# Patient Record
Sex: Female | Born: 1940 | Race: White | Hispanic: No | Smoking: Former smoker
Health system: Southern US, Community
[De-identification: ages and names within clinical notes are randomized; demographics above are authoritative.]

## PROBLEM LIST (undated history)

## (undated) DIAGNOSIS — I10 Essential (primary) hypertension: Secondary | ICD-10-CM

## (undated) DIAGNOSIS — M519 Unspecified thoracic, thoracolumbar and lumbosacral intervertebral disc disorder: Secondary | ICD-10-CM

## (undated) DIAGNOSIS — G709 Myoneural disorder, unspecified: Secondary | ICD-10-CM

## (undated) DIAGNOSIS — M48062 Spinal stenosis, lumbar region with neurogenic claudication: Secondary | ICD-10-CM

## (undated) DIAGNOSIS — Z111 Encounter for screening for respiratory tuberculosis: Secondary | ICD-10-CM

## (undated) DIAGNOSIS — R011 Cardiac murmur, unspecified: Secondary | ICD-10-CM

## (undated) DIAGNOSIS — Z8489 Family history of other specified conditions: Secondary | ICD-10-CM

## (undated) DIAGNOSIS — I639 Cerebral infarction, unspecified: Secondary | ICD-10-CM

## (undated) DIAGNOSIS — M169 Osteoarthritis of hip, unspecified: Secondary | ICD-10-CM

## (undated) HISTORY — PX: TONSILLECTOMY: SUR1361

## (undated) HISTORY — PX: BREAST SURGERY: SHX581

## (undated) HISTORY — PX: BACK SURGERY: SHX140

## (undated) HISTORY — PX: DILATION AND CURETTAGE OF UTERUS: SHX78

---

## 1998-03-16 ENCOUNTER — Other Ambulatory Visit: Admission: RE | Admit: 1998-03-16 | Discharge: 1998-03-16 | Payer: Self-pay

## 2000-07-06 ENCOUNTER — Encounter: Payer: Self-pay | Admitting: *Deleted

## 2000-07-06 ENCOUNTER — Ambulatory Visit (HOSPITAL_COMMUNITY): Admission: RE | Admit: 2000-07-06 | Discharge: 2000-07-07 | Payer: Self-pay | Admitting: *Deleted

## 2000-11-02 ENCOUNTER — Encounter: Payer: Self-pay | Admitting: Emergency Medicine

## 2000-11-02 ENCOUNTER — Encounter: Payer: Self-pay | Admitting: Neurology

## 2000-11-02 ENCOUNTER — Inpatient Hospital Stay (HOSPITAL_COMMUNITY): Admission: EM | Admit: 2000-11-02 | Discharge: 2000-11-04 | Payer: Self-pay | Admitting: Emergency Medicine

## 2002-01-17 ENCOUNTER — Encounter: Admission: RE | Admit: 2002-01-17 | Discharge: 2002-02-01 | Payer: Self-pay | Admitting: Neurology

## 2002-02-21 ENCOUNTER — Encounter: Admission: RE | Admit: 2002-02-21 | Discharge: 2002-05-22 | Payer: Self-pay | Admitting: Neurology

## 2002-10-16 ENCOUNTER — Ambulatory Visit (HOSPITAL_COMMUNITY): Admission: RE | Admit: 2002-10-16 | Discharge: 2002-10-16 | Payer: Self-pay | Admitting: Gastroenterology

## 2002-10-16 ENCOUNTER — Encounter (INDEPENDENT_AMBULATORY_CARE_PROVIDER_SITE_OTHER): Payer: Self-pay | Admitting: *Deleted

## 2004-07-23 ENCOUNTER — Emergency Department (HOSPITAL_COMMUNITY): Admission: EM | Admit: 2004-07-23 | Discharge: 2004-07-23 | Payer: Self-pay | Admitting: Emergency Medicine

## 2005-09-28 ENCOUNTER — Other Ambulatory Visit: Admission: RE | Admit: 2005-09-28 | Discharge: 2005-09-28 | Payer: Self-pay | Admitting: Diagnostic Radiology

## 2005-11-22 ENCOUNTER — Ambulatory Visit (HOSPITAL_COMMUNITY): Admission: RE | Admit: 2005-11-22 | Discharge: 2005-11-22 | Payer: Self-pay | Admitting: Neurosurgery

## 2006-06-13 ENCOUNTER — Inpatient Hospital Stay (HOSPITAL_COMMUNITY): Admission: RE | Admit: 2006-06-13 | Discharge: 2006-06-14 | Payer: Self-pay | Admitting: Neurosurgery

## 2007-09-18 ENCOUNTER — Ambulatory Visit (HOSPITAL_COMMUNITY): Admission: RE | Admit: 2007-09-18 | Discharge: 2007-09-18 | Payer: Self-pay | Admitting: Gastroenterology

## 2008-03-27 IMAGING — RF DG MYELOGRAM LUMBAR
10 series · 10 of 10 positions shown · non-contrast
Comparison: None.

CLINICAL DATA: Back pain.  Pain and numbness, left leg.  
 LUMBAR MYELOGRAM:
 Lumbar puncture injection of contrast was performed by Dr. Stein Rune.  On the lateral view, there are anterior extradural defects at L2-3, L3-4 and L4-5, the largest appearing to be at L2-3.  The alignment is normal.  Ovaries appear to exit without significant foraminal narrowing.
TECHNIQUE: Multidetector CT imaging of the lumbar spine was performed after intrathecal injection of contrast.  Multiplanar CT image reconstructions were also generated.

[Series 1: run · 1 of 1 slices shown (1 of 10)]
[im 1/1]
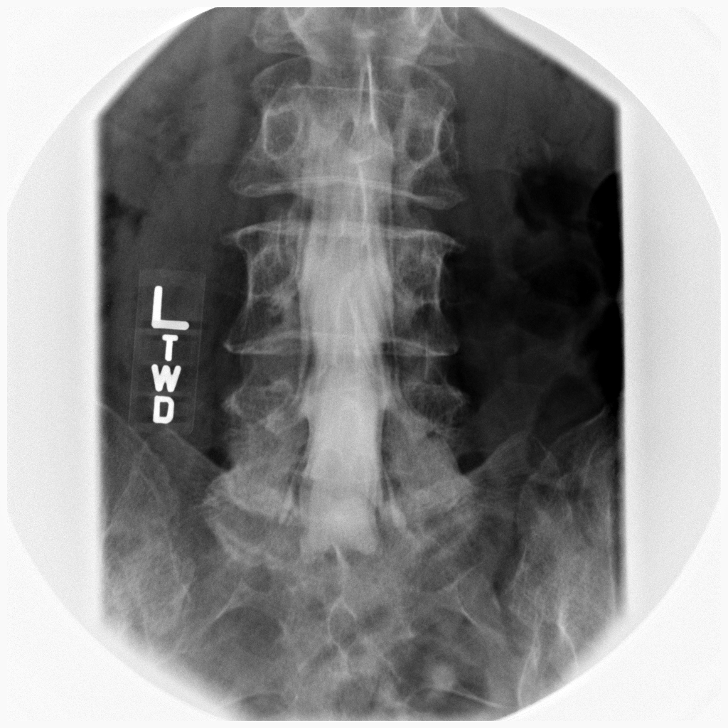

[Series 2: run · 1 of 1 slices shown (2 of 10)]
[im 1/1]
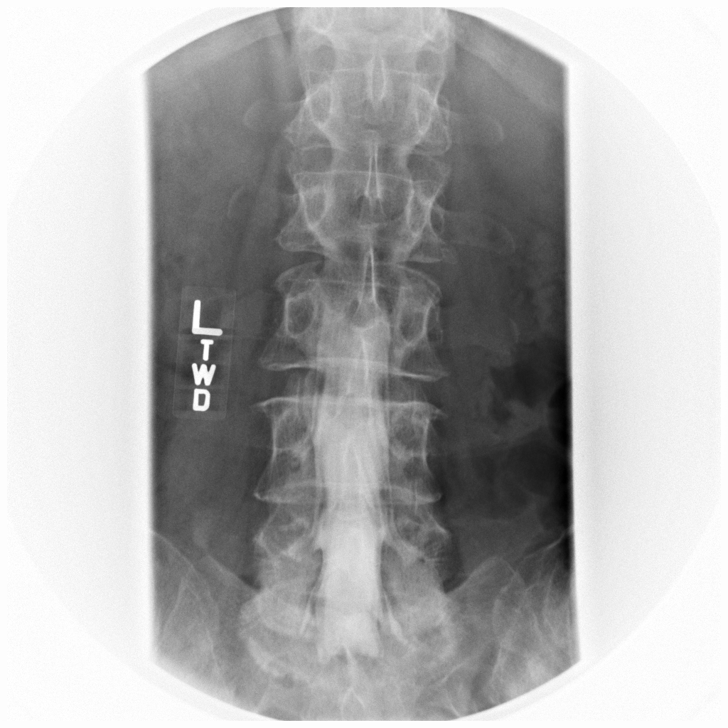

[Series 3: run · 1 of 1 slices shown (3 of 10)]
[im 1/1]
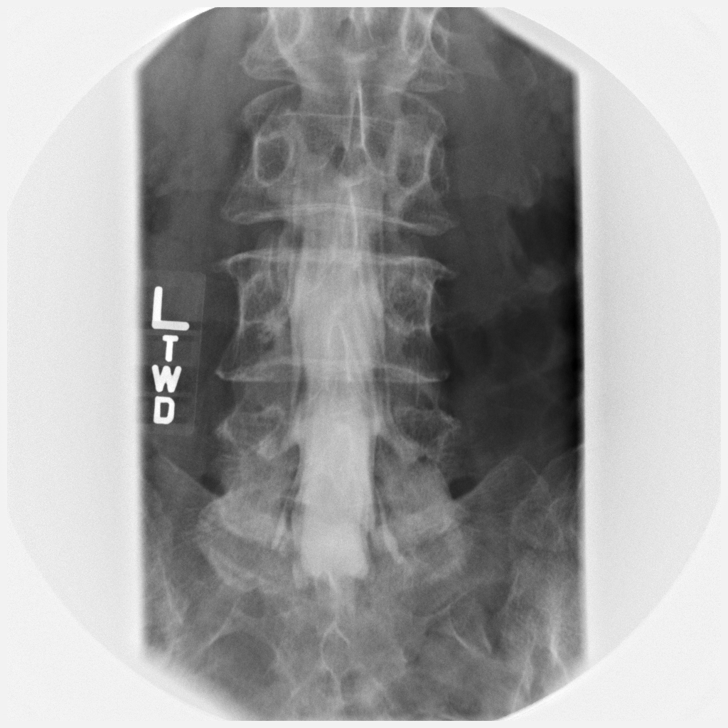

[Series 4: run · 1 of 1 slices shown (4 of 10)]
[im 1/1]
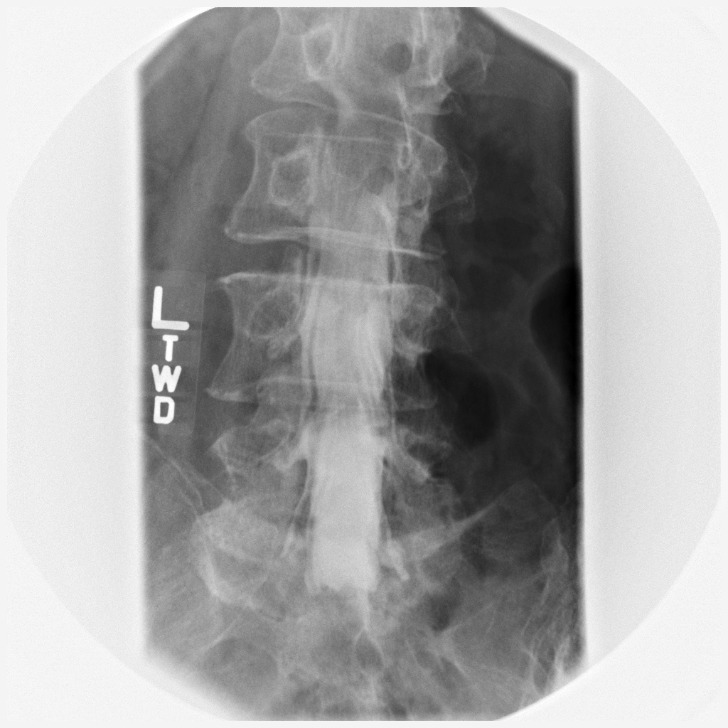

[Series 5: run · 1 of 1 slices shown (5 of 10)]
[im 1/1]
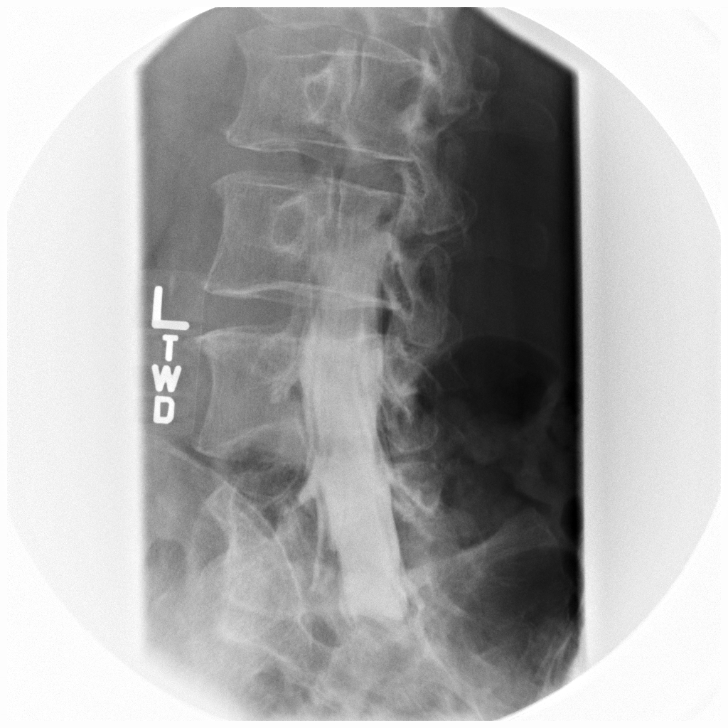

[Series 6: run · 1 of 1 slices shown (6 of 10)]
[im 1/1]
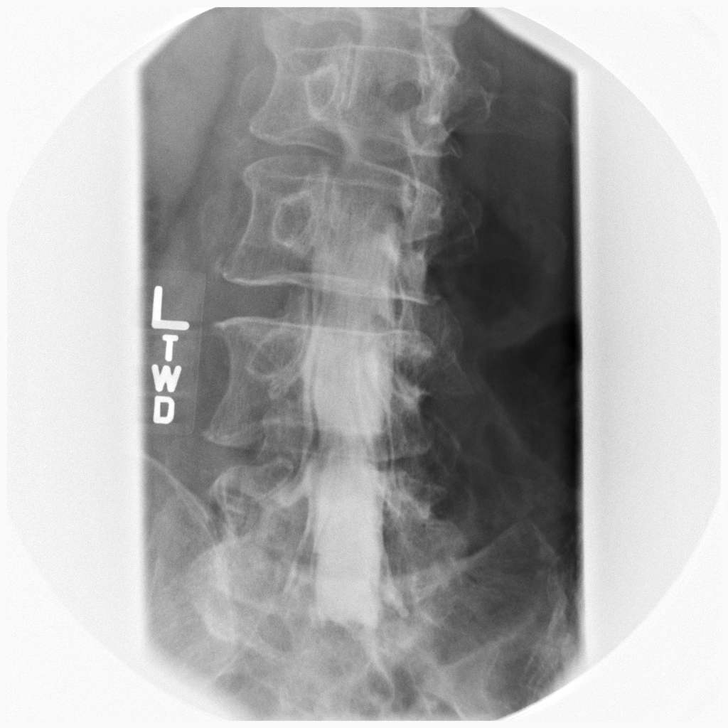

[Series 7: run · 1 of 1 slices shown (7 of 10)]
[im 1/1]
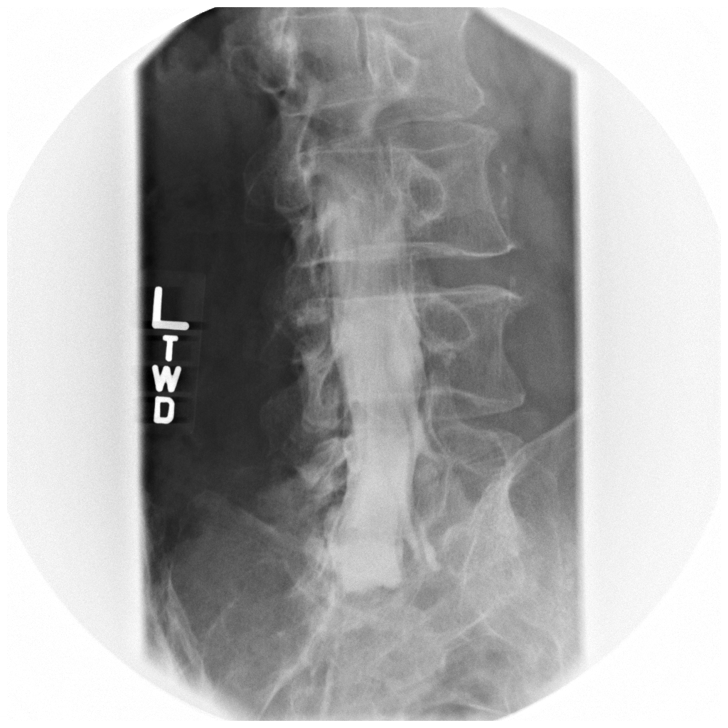

[Series 8: run · 1 of 1 slices shown (8 of 10)]
[im 1/1]
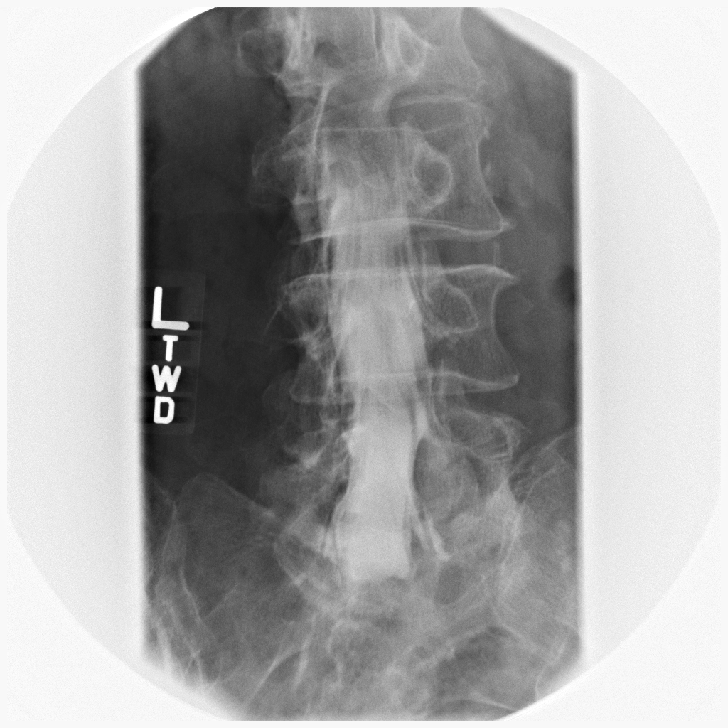

[Series 9: run · 1 of 1 slices shown (9 of 10)]
[im 1/1]
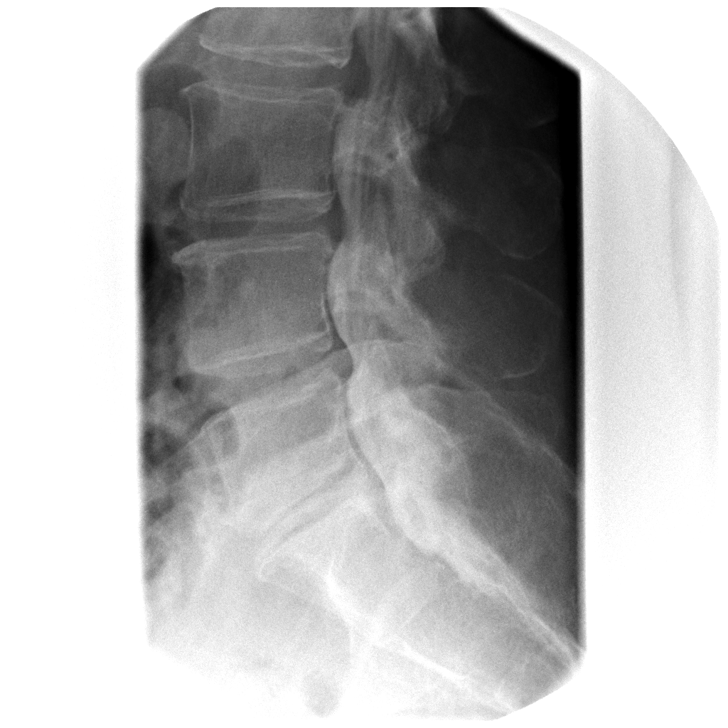

[Series 10: run · 1 of 1 slices shown (10 of 10)]
[im 1/1]
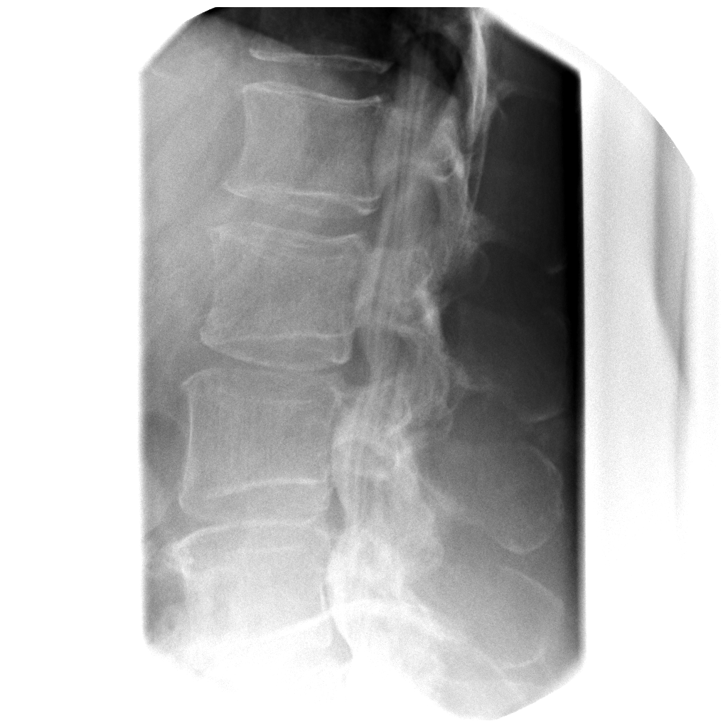

[10 of 10 positions shown; findings below may reference images not displayed]

IMPRESSION: Anterior extradural defects at L2-3, L3-4 and L4-5.
 CT MYELOGRAM, LUMBAR SPINE:
FINDINGS: The lumbar alignment is normal.  There is no fracture or mass.  The conus medullaris is normal.
 T11-12:  Negative.
 T12-L1:  Negative.
 L1-2:  Negative.
 L2-3: There is diffuse bulging of the disc.  There is a small focal disc protrusion in the right foramen which is not causing significant impingement of the L2 nerve root.  No significant central canal stenosis.  Stenosis is more prominent on the myelogram than it is on the CT at this level.  
 L3-4:  There is moderate bulging of the disc.  There is mild facet and ligamentum flavum hypertrophy without significant spinal stenosis.
 L4-5:  There is diffuse bulging of the disc with mild facet arthropathy.  No significant spinal stenosis.
 L5-S1:  Disc degeneration with vacuum phenomenon.  The disc bulges and there is mild spondylitic change without significant spinal stenosis.  There is mild foraminal narrowing bilaterally due to spurring.
 There has been prior laminectomy on the left.
IMPRESSION: 1.  Disc bulging and a small right foraminal disc protrusion at L2-3 without significant spinal stenosis.
 2.  Disc degeneration and bulging at L3-4 and L4-5.  
 3.  Postop changes on the left at L5-S1.  There is spondylitic change and mild biforaminal narrowing without significant central canal stenosis.

## 2008-04-14 ENCOUNTER — Encounter: Admission: RE | Admit: 2008-04-14 | Discharge: 2008-04-14 | Payer: Self-pay | Admitting: Family Medicine

## 2009-01-28 ENCOUNTER — Emergency Department (HOSPITAL_BASED_OUTPATIENT_CLINIC_OR_DEPARTMENT_OTHER): Admission: EM | Admit: 2009-01-28 | Discharge: 2009-01-28 | Payer: Self-pay | Admitting: Emergency Medicine

## 2009-01-28 ENCOUNTER — Ambulatory Visit: Payer: Self-pay | Admitting: Diagnostic Radiology

## 2009-01-30 ENCOUNTER — Inpatient Hospital Stay (HOSPITAL_COMMUNITY): Admission: AD | Admit: 2009-01-30 | Discharge: 2009-01-31 | Payer: Self-pay | Admitting: Neurosurgery

## 2009-01-30 ENCOUNTER — Encounter: Payer: Self-pay | Admitting: Emergency Medicine

## 2009-02-02 ENCOUNTER — Emergency Department (HOSPITAL_COMMUNITY): Admission: EM | Admit: 2009-02-02 | Discharge: 2009-02-02 | Payer: Self-pay | Admitting: Emergency Medicine

## 2010-06-23 LAB — POCT I-STAT, CHEM 8
Chloride: 92 mEq/L — ABNORMAL LOW (ref 96–112)
HCT: 44 % (ref 36.0–46.0)
Hemoglobin: 15 g/dL (ref 12.0–15.0)
Potassium: 3.7 mEq/L (ref 3.5–5.1)
Sodium: 132 mEq/L — ABNORMAL LOW (ref 135–145)

## 2010-06-23 LAB — URINALYSIS, ROUTINE W REFLEX MICROSCOPIC
Nitrite: NEGATIVE
Specific Gravity, Urine: 1.012 (ref 1.005–1.030)
Urobilinogen, UA: 0.2 mg/dL (ref 0.0–1.0)
pH: 6 (ref 5.0–8.0)

## 2010-06-23 LAB — BASIC METABOLIC PANEL
Calcium: 9.2 mg/dL (ref 8.4–10.5)
GFR calc Af Amer: >60 mL/min (ref >60)
GFR calc non Af Amer: >60 mL/min (ref >60)
Potassium: 3.9 mEq/L (ref 3.5–5.1)
Sodium: 127 mEq/L — ABNORMAL LOW (ref 135–145)

## 2010-06-23 LAB — DIFFERENTIAL
Basophils Absolute: 0.1 10*3/uL (ref 0.0–0.1)
Basophils Absolute: 0.1 10*3/uL (ref 0.0–0.1)
Basophils Relative: 1 % (ref 0–1)
Eosinophils Relative: 1 % (ref 0–5)
Lymphocytes Relative: 11 % — ABNORMAL LOW (ref 12–46)
Lymphocytes Relative: 30 % (ref 12–46)
Lymphs Abs: 1.5 10*3/uL (ref 0.7–4.0)
Monocytes Absolute: 0.3 10*3/uL (ref 0.1–1.0)
Monocytes Absolute: 0.8 10*3/uL (ref 0.1–1.0)
Neutro Abs: 3.2 10*3/uL (ref 1.7–7.7)
Neutro Abs: 7.8 10*3/uL — ABNORMAL HIGH (ref 1.7–7.7)
Neutrophils Relative %: 80 % — ABNORMAL HIGH (ref 43–77)

## 2010-06-23 LAB — APTT: aPTT: 28 seconds (ref 24–37)

## 2010-06-23 LAB — CBC
HCT: 42.7 % (ref 36.0–46.0)
Hemoglobin: 13.8 g/dL (ref 12.0–15.0)
Hemoglobin: 15.1 g/dL — ABNORMAL HIGH (ref 12.0–15.0)
MCHC: 35.3 g/dL (ref 30.0–36.0)
RBC: 3.98 MIL/uL (ref 3.87–5.11)
RDW: 12.8 % (ref 11.5–15.5)
WBC: 5.1 10*3/uL (ref 4.0–10.5)

## 2010-06-23 LAB — PROTIME-INR: Prothrombin Time: 12.6 seconds (ref 11.6–15.2)

## 2010-06-23 LAB — SAMPLE TO BLOOD BANK

## 2010-08-06 NOTE — Op Note (Signed)
Mary Adkins, DILEO                       ACCOUNT NO.:  [id number]   MEDICAL RECORD NO.:  [id number]                   PATIENT TYPE:  AMB   LOCATION:  ENDO                                 FACILITY:  [facility]   PHYSICIAN:  Anselmo Rod, M.D.               DATE OF BIRTH:  08/17/1940   DATE OF PROCEDURE:  10/16/2002  DATE OF DISCHARGE:                                 OPERATIVE REPORT   PROCEDURE PERFORMED:  Colonoscopy with snare polypectomy x1 and cold  biopsies x1.   ENDOSCOPIST:  Anselmo Rod, M.D.   INSTRUMENT USED:  Olympus video colonoscope.   INDICATIONS FOR PROCEDURE:  This 70 year old white female with a family  history of colon and breast cancer.  Rule out colonic polyps, masses, etc.   PREPROCEDURE PREPARATION:  Informed consent was procured from the patient.  The patient was fasted for eight hours prior to the procedure and prepped  with a bottle of magnesium citrate and a gallon of GoLYTELY the night prior  to the procedure.   PREPROCEDURE PHYSICAL:  VITAL SIGNS:  The patient had stable vital signs.  NECK:  Supple.  CHEST:  Clear to auscultation.  S1 and S2 regular.  ABDOMEN:  Soft with normal bowel sounds.   DESCRIPTION OF THE PROCEDURE:  The patient was placed in the left lateral  decubitus position and sedated with 60 mg of Demerol and 6 mg of Versed  intravenously.  Once the patient was adequately sedated and maintained on  low-flow oxygen and continuous cardiac monitoring, the Olympus video  colonoscope was advanced from the rectum to the cecum with some difficulty.  There was some residual stool in the colon.  A small polyp was snared from  20 cm, and another small sessile polyp was cold biopsied at 10 cm.  There  were a few early diverticula in the left colon.  The rest of the colonic  mucosa, including the transverse colon, right colon, and cecum appeared  normal.  The terminal ileum emptied normal as well.  The patient tolerated  the procedure  well without complications.   IMPRESSION:  1. Two polyps removed from the left colon (see description above).  2. A few early left-sided diverticula.  3. Normal-appearing transverse colon, right colon, cecum, and terminal     ileum.   RECOMMENDATIONS:  1. Await pathology results.  2.     Avoid nonsteroidals, including aspirin for the next two weeks.  3. High-fiber diet with liberal fluid intake.  4. Outpatient followup in the next two weeks with further recommendations.                                               Anselmo Rod, M.D.    JNM/MEDQ  D:  10/16/2002  T:  10/16/2002  Job:  [id number]   cc:   Teena Irani. Arlyce Dice, M.D.  [address]  [place]  [place] [address]  Fax: [phone]

## 2010-08-06 NOTE — H&P (Signed)
[facility]  Patient:    JONESSA, TRIPLETT                    MRN: [id number] Adm. Date:  [id number] Attending:  Cathren Laine CC:         Gloriajean Dell. Andrey Campanile, M.D.   History and Physical  HISTORY OF PRESENT ILLNESS:  Chauntay Paszkiewicz is a 70 year old right-handed white female born January 22, 1941 with a history of tobacco abuse.  This patient comes to [facility] emergency room for an evaluation of new onset of right-sided weakness.  The patient was out in the garden today picking tomatoes, came back into the house, began to feel dizzy, and then developed numbness of the right leg.  The patient then developed some numbness of the right arm - not clear whether the face was involved - but then developed some weakness of the right arm and the right leg.  The patient attempted to walk but was staggery; did not fall.  The patient at one point lost use - the husband claims - of the right leg, was unable to move it at all.  The patient lost all use of her right hand.  The patient denied any headache, visual filed changes, slurred speech.  Patient did note some dizziness but did not have any vomiting or loss of consciousness.  The patient was brought to the emergency room by EMS for further evaluation.  CT scan of the brain is unremarkable. Blood work is not yet available at the time of this dictation.  Neurology was asked to see this patient for further evaluation.  No prior history of stroke or TIA is noted.  PAST MEDICAL HISTORY: 1. History of new onset of right-sided weakness. 2. Status post left L5-S1 microdiskectomy. 3. History of breast lumpectomy in the past. 4. History of tonsillectomy.  MEDICATIONS PRIOR TO THIS ADMISSION: 1. Prometrium 100 mg daily. 2. Estradiol 0.5 mg daily. 3. Minocycline.  ALLERGIES:  Patient states an allergy to PENICILLIN, ERYTHROMYCIN, PREDNISONE, and CODEINE.  HABITS:  Patient smokes a pack of cigarettes a day.  Drinks  two glasses of red wine daily.  Was already drinking wine at 10 a.m this morning at the time the stroke developed.  SOCIAL HISTORY:  Patient lives in the [place] area, is married, has three children who are alive and well.  The patient is not working currently.  FAMILY MEDICAL HISTORY:  Notable for the fact that the mother died with stroke.  Father died with lung cancer.  Patient has one brother who is schizophrenic, may have a peripheral neuropathy.  Family history is positive for heart disease and diabetes on both sides of the family.  REVIEW OF SYSTEMS:  Notable for no fevers or chills.  Patient denies any neck pain.  Denies any shortness of breath.  Has a pressure feeling or heavy sensation on the chest.  Denies nausea, vomiting, abdominal pain, blackout episodes, bowel or bladder control problems.  PHYSICAL EXAMINATION:  VITAL SIGNS:  Blood pressure 119/43, heart rate 87 and regular, respiratory rate 24, temperature afebrile.  GENERAL:  This patient is a well-developed white female who is alert and cooperative at the time of examination.  HEENT:  Head is atraumatic.  Eyes reveal pupils are equal, round and reactive to light.  Disks are flat bilaterally.  NECK:  Supple.  No carotid bruits are noted.  ABDOMEN:  Reveals positive bowel sounds.  No organomegaly or tenderness noted.  RESPIRATORY:  Clear.  CARDIOVASCULAR:  Reveals a regular rate and rhythm.  No obvious murmurs or rubs were noted.  EXTREMITIES:  Without significant edema.  NEUROLOGIC:  Cranial nerves as above.  Facial symmetry is present.  Patient notes good sensation of face to pinprick and soft touch bilaterally.  Patient has good strength to facial muscles and the muscles of head turning and shoulder shrug bilaterally.  Extraocular movements are full.  Visual fields are full.  Speech is well-enunciated, not aphasic or dysarthric.  Motor testing reveals 4/5 strength through the grip; 4+/5 biceps,  triceps strength; 4/5 deltoid strength on the right.  The patient has 4 to 4+/5 strength to the right lower extremity.  Is able to lift the leg up off the bed against resistance.  The patient has ataxia to finger-nose-finger and toe-to-finger on the right side, normal on the left.  Good strength was noted on the left side of the body.  Deep tendon reflexes are relatively symmetric at this point. Toes are neutral bilaterally.  Patient has very definite right upper extremity drift.  Pinprick sensation is slightly depressed on the right arm and right leg as compared to the left.  Vibratory sensation is symmetric.  The patient is not ambulated.  LABORATORY DATA:  Laboratory values are pending.  Chest x-ray is unremarkable.  CT of the head without contrast is unremarkable.  EKG reveals normal sinus rhythm, normal EKG.  Heart rate was 87.  IMPRESSION: 1. Right hemiparesis, left brain stroke. 2. Tobacco abuse.  This patient has onset of right-sided hemiparesis that was severe at the onset but has significantly improved since its beginning.  Patient time-wise would be a TPA candidate but given the significant improvement that has occurred will elect not to use TPA and go with IV heparin instead.  This stroke could be a pure motor lacuna.  Need to rule out a possibility of brain stem type of event, given the prominent dizziness that occurred with the onset of the deficit.  PLAN: 1. IV heparin therapy. 2. IV fluids. 3. MRI scan of the brain. 4. MR angiogram. 5. A 2-D echocardiogram. 6. Carotid Doppler study. 7. PT/OT evaluation.  I will follow patients clinical course while in-house. DD:  11/02/00 TD:  11/02/00 Job: [id number]

## 2010-08-06 NOTE — Discharge Summary (Signed)
[place]  Patient:    Mary Adkins, Mary Adkins                    MRN: [id number] Adm. Date:  [id number] Disc. Date: [id number] Attending:  Lesly Dukes CC:         Teena Irani. Arlyce Dice, M.D.  Gloriajean Dell. Andrey Campanile, M.D.   Discharge Summary  DATE OF BIRTH:  12-23-1940  This is one of several [facility] admissions for this 70 year old right-handed white female from [place], [place] admitted for evaluation of new onset right-sided weakness.  HISTORY OF PRESENT ILLNESS:  This patient was in her garden the day of admission picking tomatoes.  Came into the house and noted a sensation of dizziness, numbness in her right leg, and some numbness in her right arm.  At that time it was not certain whether or not her face was involved.  She had no associated headache, syncope, or seizure.  She lost all use of her right hand. She came to the emergency room with improvement in her symptomatology.  CT scan of the brain was unremarkable and she was admitted for further evaluation of suspected stroke.  She has no known prior history of high blood pressure, diabetes, heart disease, or stroke.  She has not been taking aspirin.  She has been on hormones, estradiol 0.5 mg q.d. and Prometrium 100 mg q.d.  PAST MEDICAL HISTORY:  Significant for new onset left brain stroke.  She is status post left L5-S1 microdiskectomy.  She has had a lumpectomy in the past and a tonsillectomy.  ALLERGIES:  PENICILLIN, ERYTHROMYCIN, PREDNISONE, CODEINE.  HABITS:  The patient does smoke one pack per day of cigarettes.  She drinks two glasses of red wine per day.  FAMILY HISTORY:  Positive for lung cancer in that her father died from lung cancer.  Notable that her mother died from stroke.  Patient has one brother who is schizophrenic.  He is married to a patient who has manic depressive illness.  There is a positive family history of peripheral neuropathy.  SOCIAL  HISTORY:  She lives in [place] and is married.  Has three children in good health.  REVIEW OF SYSTEMS:  Unremarkable.  PHYSICAL EXAMINATION  VITAL SIGNS:  Blood pressure 119/43.  GENERAL:  Unremarkable.  NEUROLOGIC:  Cranial nerve examination is normal except for possibly some decrease in the right corner of her mouth.  She had clumsiness in her right hand and arm and some slight ataxia in her right hand and arm.  Her deep tendon reflexes were symmetric at 2+ and her plantar responses were bilaterally downgoing.  She had depressed pin prick in her right hand and arm.  LABORATORIES:  CT scan of the brain was unremarkable.  A chest x-ray showed no active cardiopulmonary process.  MRI study of the brain showed 1 cm lesion of acute infarction involving the posterior limb of the internal capsule and lateral thalamus on the left.  Otherwise, was a negative study.  Intercranial MR angiography showed normal intercranial MR angiography.  A 12-lead EKG showed normal sinus rhythm, was a normal EKG.  A 2-D echocardiogram showed overall left ventricular function test to be normal, left ventricular aortic valve thickness mildly increased, trivial mitral valvular regurgitation, mild tricuspid valvular regurgitation, no cardiac source of embolism identified. Her Doppler study of the carotids showed no evidence of ICA stenosis and vertebral flow to be antegrade.  Hemoglobin 14.7, hematocrit 42.2, white blood cell  count 7000, platelet count 192,000, 63% polys, 29% lymphs, 6% monocytes, 3% eosinophils, 1% basophil. Estimated sedimentation rate 7.  PT 13.2, INR 1.0, PTT 28.  While on heparin PTT therapeutic 57-153 range.  Cholesterol 155, triglycerides 93, HDL 47, LDL 89.  Sodium 140, potassium 4.0, chloride 101, CO2 content 30, glucose 98, BUN 5, creatinine 0.8, calcium 8.7.  HOSPITAL COURSE:  Patient was admitted with right-sided clumsiness which improved significantly in the hospital, mild  right facial improved in the hospital.  Blood pressure ranged in the 120-130/80 range.  O2 saturations were 96% on room air.  She remained afebrile.  She had no recurrence of neurologic symptoms.  She was placed on heparin therapy until results of studies have returned.  IMPRESSION: 1. 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 2. Cigarette use (code 496.0). 3. Status post left L5-S1 radiculopathy with surgery (code 353.4). 4. Status post lumpectomy. 5. History of tonsillectomy.  PLAN:  Place her on aspirin 325 mg q.d.  I have recommended that she discontinue her hormonal therapy at this time.  She is not to drive a car for three weeks.  Return to see me in three weeks. DD:  11/04/00 TD:  11/05/00 Job: [id number]

## 2010-08-06 NOTE — Op Note (Signed)
[name], Mary Adkins           ACCOUNT NO.:  [id number]   MEDICAL RECORD NO.:  [id number]          PATIENT TYPE:  INP   LOCATION:  3172                         FACILITY:  [facility]   PHYSICIAN:  Hilda Lias, M.D.   DATE OF BIRTH:  [birth year]   DATE OF PROCEDURE:  06/13/2006  DATE OF DISCHARGE:                               OPERATIVE REPORT   PREOPERATIVE DIAGNOSES:  1. 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  2. Degenerative disk disease.  3. Status post 5-1 diskectomy.   POSTOPERATIVE DIAGNOSES:  1. 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  2. Degenerative disk disease.  3. Status post 5-1 diskectomy.   PROCEDURE:  1. Left L5-S1 foraminotomy.  2. Lysis of adhesions.  3. Decompression of the L5-S1 nerve root and removal of calcified      disk.  4. Microscope.   SURGEON:  Hilda Lias, M.D.   CLINICAL HISTORY:  The patient is admitted because of back pain with  radiation to the left leg.  The pain is mostly a sensory type of pain.  She has no weakness.  X-ray showed degenerative disk disease at the  level of 5-1 with a stenosis.  The patient complained of no pain  whatsoever in the right leg.  Surgery was advised and she was aware of  the possibility that she might require fusion down the line.   PROCEDURE:  The patient was taken to the OR and she was positioned in a  prone manner.  The skin was cleaned with DuraPrep.  A midline incision  following the previous one was made through the skin and subcutaneous  tissue down to the lumbar spine.  X-rays showed that indeed we were at  the level of 5-1.  Then with the microscope and the drill, we drilled  the lower part of the lamina of L5, the upper of S1 and the medial  facet.  The patient had quite a bit of adhesion and lysis was  accomplished.  At the end, we were able to retract the S1 and L5 nerve  root.  There was small calcified disk right at the takeoff of the S1.  Incision was made  and a small amount of degenerative disk partially  calcified was removed.  At the end, we have a good decompression of both  nerve roots.  Valsalva maneuver was negative.  Then fentanyl and Depo-  Medrol were left in the epidural space and the wound was closed with  Vicryl and Steri-Strips.           ______________________________  Hilda Lias, M.D.    EB/MEDQ  D:  06/13/2006  T:  06/13/2006  Job:  [id number]

## 2010-08-06 NOTE — Op Note (Signed)
[place]  Patient:    LATREECE, MOCHIZUKI                    MRN: [id number] Proc. Date: 07/06/00 Adm. Date:  [id number] Attending:  Maryanna Shape                           Operative Report  POSTOPERATIVE DIAGNOSIS:  Herniated nucleus pulposus L5-S1, left.  POSTOPERATIVE DIAGNOSIS:  Herniated nucleus pulposus L5-S1, left.  OPERATIVE PROCEDURE:  Microdiskectomy L5-S1, left.  ANESTHESIA:  General with endotracheal tube.  SURGEON:  Reynolds Bowl, M.D.  ASSISTANT:  Cammy Copa, M.D.  DESCRIPTION OF PROCEDURE:  The patient was given general anesthetic via endotracheal tube and then placed on the operative frame, prepped and draped in the usual manner for sterility.  All bony prominences were protected.  Her arms were placed in a comfortable position.  She was then prepped with DuraPrep and draped as usual.  This included the use of a Vi-Drape.  I used two #18 gauge needles to mark and identify the L5-S1 interspace. X-rays were taken, and on the basis of these localizing needles, a short posterior incision was made.  Subcutaneous tissues dissected to the lumbar fascia.  The lumbar fascia was divided vertically.  Paraspinous muscles were subperiosteally reflected laterally.  A Taylor retractor was put in place.  The soft tissues around the ligamentum flavum were cleared away and with minimal dissection came upon an opening in the ligamentum flavum near midline, placed a patty in this opening between the epidural fat and the dura and then excised the ligamentum flavum.  I then used the #4 Penfield probe to dissect. There was some very large veins in the area, protected those to keep from bleeding.  Did not use a Bovie.  Found that it was a little too tight to move without further dissection laterally.  So laterally, I removed some bone and removed bone out the foramen so that I could get the Koloa to the lateral side of the nerve  root, then gently teased it medially, exposing a fairly large HNP that was contained.  With the nerve root protected, the annulus was opened, and the fairly large mass of contained disk was removed from a pocket posterior to the L5-S1 interspace.  There was two expiration just before opening.  There were no other events or free fragments around.  Then attention was spent on emptying the disk space of all loose disk material, using a micropituitary and an Epstein push curettes, and after satisfying myself the disk space was emptied, the area was thoroughly irrigated.  We explored with the ball tip nerve root retractor and found out the foramen medially, laterally, and proximally, the nerve root was quite free. The area was irrigated.  The [facility] retractor was removed.  The lumbar fascia was approximated with figure-of-eight sutures of #1 Vicryl, subcutaneous tissues approximated with 2-0 Vicryl, and the skin edges held in that position with metal staples, following which a band-aid was applied.  The patient was removed from the operating table and returned to the recovery room in good condition.  The estimated blood loss from this procedure was negligible.  There were no apparent problems. DD:  07/06/00 TD:  07/06/00 Job: [id number]

## 2010-08-06 NOTE — H&P (Signed)
NAMEBENAY, POMEROY           ACCOUNT NO.:  [id number]   MEDICAL RECORD NO.:  [id number]          PATIENT TYPE:  INP   LOCATION:  3172                         FACILITY:  [facility]   PHYSICIAN:  Hilda Lias, M.D.   DATE OF BIRTH:  04-07-1940   DATE OF ADMISSION:  06/13/2006  DATE OF DISCHARGE:                              HISTORY & PHYSICAL   HISTORY OF PRESENT ILLNESS:  Mrs. Quaranta is a lady who was seen by me  initially about a year ago complaining of back pain that radiates down  to the left leg.  In the year 2002, she underwent a left L5-S1  diskectomy.  She had had conservative treatment of physical therapy and  steroid injections.  She is not in therapy.  We repeated the x-ray and  found that she has stenosis at the level of L5-S1 to the left which goes  along with the sensory pain that she is having.   PAST MEDICAL HISTORY:  1. L5-S1 diskectomy in 2002.  2. Tumor removed from the breast.   SHE IS ALLERGIC TO:  1. LYRICA.  2. NEURONTIN.  3. LIPITOR.  4. PREDINSONE.  5. PENICILLIN.  6. CODEINE.  7. FOSAMAX.   FAMILY HISTORY:  Mother has a history of high blood pressure.  There is  no history of diabetes in her family.   SOCIAL HISTORY:  She drinks once in a while and she smokes half a pack a  day.   REVIEW OF SYSTEMS:  Positive for nasal congestion, back pain, arthritis.  She has a stroke with a right-side hemiparesis.   PHYSICAL EXAMINATION:  HEAD:  Atraumatic.  NECK:  Normal.  LUNGS:  Clear.  HEART:  Heart sounds normal.  ABDOMEN:  Normal.  EXTREMITIES:  Normal pulses.  NEUROLOGIC:  Oriented x3.  I cannot find any weakness.  Reflexes are  normal.  Sensation:  She complains of burning pain which involved mostly  the L5-S1 dermatome.  There is absence of the left ankle jerk.   X-ray showed that she has a severe degenerative disk disease at the L5-  S1 with a foraminal stenosis.   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   Initially, the patient will be admitted for surgery.  The procedure will  be a left L5-S1 foraminotomy and lysis of adhesions.  She knows about  the risks such as of infection, CSF leak, worsening of pain, no  improvement whatsoever, need for further surgery, and damage to vessels  of the abdomen.           ______________________________  Hilda Lias, M.D.     EB/MEDQ  D:  06/13/2006  T:  06/13/2006  Job:  [id number]

## 2012-05-11 ENCOUNTER — Other Ambulatory Visit: Payer: Self-pay | Admitting: Family Medicine

## 2012-05-14 ENCOUNTER — Ambulatory Visit
Admission: RE | Admit: 2012-05-14 | Discharge: 2012-05-14 | Disposition: A | Discharge status: Home / Self Care | Payer: Medicare PPO | Source: Ambulatory Visit | Attending: Family Medicine | Admitting: Family Medicine

## 2012-05-15 ENCOUNTER — Other Ambulatory Visit: Payer: Self-pay

## 2012-05-28 ENCOUNTER — Other Ambulatory Visit: Payer: Self-pay | Admitting: Orthopedic Surgery

## 2012-05-28 ENCOUNTER — Other Ambulatory Visit: Payer: Self-pay | Admitting: Neurosurgery

## 2012-05-29 ENCOUNTER — Encounter (HOSPITAL_COMMUNITY): Payer: Self-pay | Admitting: Pharmacy Technician

## 2012-05-29 ENCOUNTER — Ambulatory Visit
Admission: RE | Admit: 2012-05-29 | Discharge: 2012-05-29 | Disposition: A | Discharge status: Home / Self Care | Payer: Medicare PPO | Source: Ambulatory Visit | Attending: Orthopedic Surgery | Admitting: Orthopedic Surgery

## 2012-05-29 DIAGNOSIS — M25551 Pain in right hip: Secondary | ICD-10-CM

## 2012-05-31 ENCOUNTER — Encounter (HOSPITAL_COMMUNITY)
Admission: RE | Admit: 2012-05-31 | Discharge: 2012-05-31 | Disposition: A | Discharge status: Home / Self Care | Payer: Medicare PPO | Source: Ambulatory Visit | Attending: Neurosurgery | Admitting: Neurosurgery

## 2012-05-31 ENCOUNTER — Encounter (HOSPITAL_COMMUNITY)
Admission: RE | Admit: 2012-05-31 | Discharge: 2012-05-31 | Disposition: A | Discharge status: Home / Self Care | Payer: Medicare PPO | Source: Ambulatory Visit | Attending: Orthopedic Surgery | Admitting: Orthopedic Surgery

## 2012-05-31 ENCOUNTER — Other Ambulatory Visit: Payer: Self-pay | Admitting: Physician Assistant

## 2012-05-31 ENCOUNTER — Encounter (HOSPITAL_COMMUNITY): Payer: Self-pay

## 2012-05-31 LAB — ABO/RH: ABO/RH(D): A POS

## 2012-05-31 LAB — PROTIME-INR: Prothrombin Time: 12.1 seconds (ref 11.6–15.2)

## 2012-05-31 LAB — BASIC METABOLIC PANEL
BUN: 9 mg/dL (ref 6–23)
Calcium: 9.9 mg/dL (ref 8.4–10.5)
GFR calc non Af Amer: 86 mL/min — ABNORMAL LOW (ref >90)
Glucose, Bld: 103 mg/dL — ABNORMAL HIGH (ref 70–99)
Sodium: 136 mEq/L (ref 135–145)

## 2012-05-31 LAB — CBC
MCH: 33.5 pg (ref 26.0–34.0)
MCHC: 34.8 g/dL (ref 30.0–36.0)
Platelets: 246 10*3/uL (ref 150–400)

## 2012-05-31 LAB — SURGICAL PCR SCREEN: Staphylococcus aureus: NEGATIVE

## 2012-05-31 MED ORDER — CEFAZOLIN SODIUM-DEXTROSE 2-3 GM-% IV SOLR
2.0000 g | INTRAVENOUS | Status: DC
  Filled 2012-05-31: qty 50

## 2012-05-31 NOTE — H&P (Signed)
TOTAL HIP ADMISSION H&P  Patient is admitted for right total hip arthroplasty.  Subjective:  Chief Complaint: right hip pain  HPI: Mary Adkins, 72 y.o. female, has a history of pain and functional disability in the right hip(s) due to femoral neck fracture with underlying OA and patient has failed non-surgical conservative treatments for greater than 6-8 weeks to include NSAID's and/or analgesics, use of assistive devices and activity modification.  Onset of symptoms was abrupt starting 6-8 weeks ago with gradually worsening course since that time.The patient noted no past surgery on the right hip(s).  Patient currently rates pain in the right hip at 8 out of 10 with activity. Patient has worsening of pain with activity and weight bearing, pain that interfers with activities of daily living and pain with passive range of motion. Patient has evidence of joint space narrowing and nondisplaced femoral neck fracture by imaging studies. This condition presents safety issues increasing the risk of falls.   There is no current active infection.  There are no active problems to display for this patient.  Past Medical History  Diagnosis Date  . Family history of anesthesia complication     pt's mother "died during anesthesia but they brought her back"  . Hypertension   . Heart murmur     per pt. & Dr. Cabbell  . 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 ago, took med.  . Neuromuscular disorder     hip, back  . 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 Breast surgery  Left, benign results   . Tonsillectomy    . Dilation and curettage of uterus    . Back surgery  x3 back surgery     (Not in a hospital admission) Allergies  Allergen Reactions  . Acetazolamide Other (See Comments)    Face swelled and peeled  . Codeine Nausea And Vomiting  . Crestor (Rosuvastatin) Other (See Comments)    Muscle aches  . Diazepam Other (See Comments)     Put her to sleep  . Lipitor (Atorvastatin) Other (See Comments)    Muscle aches  . Prednisone Other (See Comments)    Rapid heart rate  . Penicillins Rash    History  Substance Use Topics  . Smoking status: Former Smoker    Types: Cigarettes    Quit date: 05/04/2011  . Smokeless tobacco: Not on file  . Alcohol Use: Yes     Comment: wine - everyday- one glass     No family history on file.   Review of Systems  Constitutional: Negative.   HENT: Negative.   Eyes: Negative.   Respiratory: Negative.   Cardiovascular: Negative.   Gastrointestinal: Negative.   Genitourinary: Negative.   Musculoskeletal: Positive for joint pain. Negative for falls.  Skin: Negative.   Neurological: Negative.   Endo/Heme/Allergies: Negative.   Psychiatric/Behavioral: Negative.     Objective:  Physical Exam  Constitutional: She is oriented to person, place, and time. She appears well-developed and well-nourished. No distress.  HENT:  Head: Normocephalic and atraumatic.  Nose: Nose normal.  Eyes: Conjunctivae and EOM are normal. Pupils are equal, round, and reactive to light.  Neck: Normal range of motion. Neck supple.  Cardiovascular: Normal rate, regular rhythm and intact distal pulses.   Murmur heard. Respiratory: Effort normal and breath sounds normal. She has no wheezes. She has no rales. She exhibits no tenderness.    GI: Soft. Bowel sounds are normal. She exhibits no distension. There is no tenderness.  Musculoskeletal:       Right hip: She exhibits decreased range of motion, decreased strength, tenderness and bony tenderness. She exhibits no deformity and no laceration.  Lymphadenopathy:    She has no cervical adenopathy.  Neurological: She is alert and oriented to person, place, and time. No cranial nerve deficit.  Skin: Skin is warm and dry. No rash noted. No erythema.  Psychiatric: She has a normal mood and affect. Her behavior is normal.    Vital signs in last 24  hours: @VSRANGES@  Labs:   Estimated body mass index is 26.22 kg/(m^2) as calculated from the following:   Height as of an earlier encounter on 05/31/12: 5' 6" (1.676 m).   Weight as of an earlier encounter on 05/31/12: 73.664 kg (162 lb 6.4 oz).   Imaging Review Plain radiographs demonstrate moderate degenerative joint disease of the right hip(s). The bone quality appears to be good for age and reported activity level.  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 The treatment options including medical management, injection therapy, arthroscopy and arthroplasty were discussed at length. The risks and benefits of total hip arthroplasty were presented and reviewed. The risks due to aseptic loosening, infection, stiffness, dislocation/subluxation,  thromboembolic complications and other imponderables were discussed.  The patient acknowledged the explanation, agreed to proceed with the plan and consent was signed. Patient is being admitted for inpatient treatment for surgery, pain control, PT, OT, prophylactic antibiotics, VTE prophylaxis, progressive ambulation and ADL's and discharge planning.The patient is planning to be discharged home with home health services 

## 2012-05-31 NOTE — Progress Notes (Signed)
Call to Dr. Sueanne Margarita office, left msg. For Darl Pikes to call for clarification of surg. Plan. Call to Dr. Candise Bowens office, spoke with Tresa Endo re: surg. Plan.

## 2012-05-31 NOTE — Pre-Procedure Instructions (Signed)
Mary Adkins  05/31/2012   Your procedure is scheduled on:  06/01/2012  Report to [facility] at 5:30 AM.  Call this number if you have problems the morning of surgery: [phone]   Remember:   Do not eat food or drink liquids after midnight.   Take these medicines the morning of surgery with A SIP OF WATER: amlodipine and PAIN medicine as needed    Do not wear jewelry, make-up or nail polish.  Do not wear lotions, powders, or perfumes. You may wear deodorant.  Do not shave 48 hours prior to surgery.  Do not bring valuables to the hospital.  Contacts, dentures or bridgework may not be worn into surgery.  Leave suitcase in the car. After surgery it may be brought to your room.  For patients admitted to the hospital, checkout time is 11:00 AM the day of  discharge.   Patients discharged the day of surgery will not be allowed to drive  home.  Name and phone number of your driver: /w family  Special Instructions: Shower using CHG 2 nights before surgery and the night before surgery.  If you shower the day of surgery use CHG.  Use special wash - you have one bottle of CHG for all showers.  You should use approximately 1/3 of the bottle for each shower.   Please read over the following fact sheets that you were given: Pain Booklet, Coughing and Deep Breathing, Blood Transfusion Information, Total Joint Packet, MRSA Information and Surgical Site Infection Prevention

## 2012-05-31 NOTE — Progress Notes (Signed)
Spoke Mary Adkins at Dr. Candise Bowens office & informed her that pt. Doesn't have any orders available specific to Dr. Madelon Lips.

## 2012-05-31 NOTE — Progress Notes (Signed)
Pt. reports that she is followed at OR [facility] ) Family , unsure of when or where she had last ekg.

## 2012-06-01 ENCOUNTER — Encounter (HOSPITAL_COMMUNITY)
Admission: RE | Disposition: A | Discharge status: Home Health | Payer: Self-pay | Source: Ambulatory Visit | Attending: Orthopedic Surgery

## 2012-06-01 ENCOUNTER — Encounter (HOSPITAL_COMMUNITY): Payer: Self-pay | Admitting: Anesthesiology

## 2012-06-01 ENCOUNTER — Inpatient Hospital Stay (HOSPITAL_COMMUNITY): Payer: Medicare PPO

## 2012-06-01 ENCOUNTER — Inpatient Hospital Stay (HOSPITAL_COMMUNITY)
Admission: RE | Admit: 2012-06-01 | Discharge: 2012-06-03 | DRG: 470 | Disposition: A | Discharge status: Home Health | Payer: Medicare PPO | Source: Ambulatory Visit | Attending: Orthopedic Surgery | Admitting: Orthopedic Surgery

## 2012-06-01 ENCOUNTER — Encounter (HOSPITAL_COMMUNITY): Payer: Self-pay | Admitting: *Deleted

## 2012-06-01 ENCOUNTER — Ambulatory Visit (HOSPITAL_COMMUNITY): Payer: Medicare PPO | Admitting: Anesthesiology

## 2012-06-01 DIAGNOSIS — M51379 Other intervertebral disc degeneration, lumbosacral region without mention of lumbar back pain or lower extremity pain: Secondary | ICD-10-CM | POA: Diagnosis present

## 2012-06-01 DIAGNOSIS — Z87891 Personal history of nicotine dependence: Secondary | ICD-10-CM

## 2012-06-01 DIAGNOSIS — I69998 Other sequelae following unspecified cerebrovascular disease: Secondary | ICD-10-CM

## 2012-06-01 DIAGNOSIS — I639 Cerebral infarction, unspecified: Secondary | ICD-10-CM | POA: Diagnosis present

## 2012-06-01 DIAGNOSIS — M169 Osteoarthritis of hip, unspecified: Secondary | ICD-10-CM | POA: Diagnosis present

## 2012-06-01 DIAGNOSIS — D62 Acute posthemorrhagic anemia: Secondary | ICD-10-CM | POA: Diagnosis not present

## 2012-06-01 DIAGNOSIS — I10 Essential (primary) hypertension: Secondary | ICD-10-CM | POA: Diagnosis present

## 2012-06-01 DIAGNOSIS — E876 Hypokalemia: Secondary | ICD-10-CM | POA: Diagnosis not present

## 2012-06-01 DIAGNOSIS — M84353A Stress fracture, unspecified femur, initial encounter for fracture: Principal | ICD-10-CM | POA: Diagnosis present

## 2012-06-01 DIAGNOSIS — M519 Unspecified thoracic, thoracolumbar and lumbosacral intervertebral disc disorder: Secondary | ICD-10-CM | POA: Diagnosis present

## 2012-06-01 DIAGNOSIS — G709 Myoneural disorder, unspecified: Secondary | ICD-10-CM | POA: Diagnosis present

## 2012-06-01 DIAGNOSIS — M1610 Unilateral primary osteoarthritis, unspecified hip: Secondary | ICD-10-CM | POA: Diagnosis present

## 2012-06-01 DIAGNOSIS — R011 Cardiac murmur, unspecified: Secondary | ICD-10-CM | POA: Diagnosis present

## 2012-06-01 HISTORY — DX: Encounter for screening for respiratory tuberculosis: Z11.1

## 2012-06-01 HISTORY — DX: Cerebral infarction, unspecified: I63.9

## 2012-06-01 HISTORY — DX: Family history of other specified conditions: Z84.89

## 2012-06-01 HISTORY — PX: TOTAL HIP ARTHROPLASTY: SHX124

## 2012-06-01 HISTORY — DX: Myoneural disorder, unspecified: G70.9

## 2012-06-01 HISTORY — DX: Essential (primary) hypertension: I10

## 2012-06-01 HISTORY — DX: Cardiac murmur, unspecified: R01.1

## 2012-06-01 HISTORY — DX: Osteoarthritis of hip, unspecified: M16.9

## 2012-06-01 HISTORY — DX: Unspecified thoracic, thoracolumbar and lumbosacral intervertebral disc disorder: M51.9

## 2012-06-01 LAB — COMPREHENSIVE METABOLIC PANEL
ALT: 11 U/L (ref 0–35)
Alkaline Phosphatase: 46 U/L (ref 39–117)
BUN: 12 mg/dL (ref 6–23)
CO2: 31 mEq/L (ref 19–32)
GFR calc Af Amer: >90 mL/min (ref >90)
GFR calc non Af Amer: 85 mL/min — ABNORMAL LOW (ref >90)
Glucose, Bld: 118 mg/dL — ABNORMAL HIGH (ref 70–99)
Potassium: 4.1 mEq/L (ref 3.5–5.1)
Sodium: 140 mEq/L (ref 135–145)
Total Bilirubin: 0.4 mg/dL (ref 0.3–1.2)
Total Protein: 6.5 g/dL (ref 6.0–8.3)

## 2012-06-01 LAB — CBC
HCT: 34.6 % — ABNORMAL LOW (ref 36.0–46.0)
Hemoglobin: 12 g/dL (ref 12.0–15.0)
MCHC: 34.7 g/dL (ref 30.0–36.0)
RBC: 3.58 MIL/uL — ABNORMAL LOW (ref 3.87–5.11)

## 2012-06-01 SURGERY — LUMBAR LAMINECTOMY/DECOMPRESSION MICRODISCECTOMY 1 LEVEL
Anesthesia: General

## 2012-06-01 SURGERY — ARTHROPLASTY, HIP, TOTAL,POSTERIOR APPROACH
Anesthesia: General | Site: Hip | Laterality: Right | Wound class: Clean

## 2012-06-01 MED ORDER — BISACODYL 10 MG RE SUPP: 10.0000 mg | Freq: Every day | RECTAL | Status: DC | PRN

## 2012-06-01 MED ORDER — LACTATED RINGERS IV SOLN: INTRAVENOUS | Status: DC

## 2012-06-01 MED ORDER — LIDOCAINE HCL (CARDIAC) 20 MG/ML IV SOLN
INTRAVENOUS | Status: DC | PRN
  Administered 2012-06-01: 100 mg via INTRAVENOUS

## 2012-06-01 MED ORDER — NEOSTIGMINE METHYLSULFATE 1 MG/ML IJ SOLN
INTRAMUSCULAR | Status: DC | PRN
  Administered 2012-06-01: 4 mg via INTRAVENOUS

## 2012-06-01 MED ORDER — METOCLOPRAMIDE HCL 5 MG/ML IJ SOLN: 5.0000 mg | Freq: Three times a day (TID) | INTRAMUSCULAR | Status: DC | PRN

## 2012-06-01 MED ORDER — ROCURONIUM BROMIDE 100 MG/10ML IV SOLN
INTRAVENOUS | Status: DC | PRN
  Administered 2012-06-01: 50 mg via INTRAVENOUS
  Administered 2012-06-01: 20 mg via INTRAVENOUS

## 2012-06-01 MED ORDER — PROPOFOL 10 MG/ML IV BOLUS
INTRAVENOUS | Status: DC | PRN
  Administered 2012-06-01: 160 mg via INTRAVENOUS

## 2012-06-01 MED ORDER — VITAMIN D3 25 MCG (1000 UNIT) PO TABS
1000.0000 [IU] | ORAL_TABLET | Freq: Every day | ORAL | Status: DC
  Administered 2012-06-02 – 2012-06-03 (×2): 1000 [IU] via ORAL
  Filled 2012-06-01 (×2): qty 1

## 2012-06-01 MED ORDER — CLINDAMYCIN PHOSPHATE 900 MG/50ML IV SOLN
900.0000 mg | INTRAVENOUS | Status: AC
  Administered 2012-06-01: 900 mg via INTRAVENOUS
  Filled 2012-06-01: qty 50

## 2012-06-01 MED ORDER — PROMETHAZINE HCL 25 MG/ML IJ SOLN: 6.2500 mg | INTRAMUSCULAR | Status: DC | PRN

## 2012-06-01 MED ORDER — SODIUM CHLORIDE 0.9 % IV SOLN
INTRAVENOUS | Status: DC
  Administered 2012-06-01 – 2012-06-02 (×2): via INTRAVENOUS

## 2012-06-01 MED ORDER — PHENOL 1.4 % MT LIQD: 1.0000 | OROMUCOSAL | Status: DC | PRN

## 2012-06-01 MED ORDER — SODIUM CHLORIDE 0.9 % IV SOLN
INTRAVENOUS | Status: DC | PRN
  Administered 2012-06-01: 08:00:00 via INTRAVENOUS

## 2012-06-01 MED ORDER — PHENYLEPHRINE HCL 10 MG/ML IJ SOLN
INTRAMUSCULAR | Status: DC | PRN
  Administered 2012-06-01 (×3): 40 ug via INTRAVENOUS
  Administered 2012-06-01 (×2): 80 ug via INTRAVENOUS
  Administered 2012-06-01 (×3): 40 ug via INTRAVENOUS

## 2012-06-01 MED ORDER — ENOXAPARIN SODIUM 30 MG/0.3ML ~~LOC~~ SOLN
30.0000 mg | Freq: Two times a day (BID) | SUBCUTANEOUS | Status: DC
  Administered 2012-06-01: 30 mg via SUBCUTANEOUS
  Filled 2012-06-01 (×3): qty 0.3

## 2012-06-01 MED ORDER — LACTATED RINGERS IV SOLN
INTRAVENOUS | Status: DC | PRN
  Administered 2012-06-01 (×2): via INTRAVENOUS

## 2012-06-01 MED ORDER — HYDROMORPHONE HCL PF 1 MG/ML IJ SOLN
0.2500 mg | INTRAMUSCULAR | Status: DC | PRN
  Administered 2012-06-01 (×3): 0.25 mg via INTRAVENOUS
  Administered 2012-06-01: 0.5 mg via INTRAVENOUS
  Administered 2012-06-01: 0.25 mg via INTRAVENOUS
  Administered 2012-06-01: 0.5 mg via INTRAVENOUS

## 2012-06-01 MED ORDER — LOSARTAN POTASSIUM 25 MG PO TABS
25.0000 mg | ORAL_TABLET | Freq: Every day | ORAL | Status: DC
  Filled 2012-06-01: qty 1

## 2012-06-01 MED ORDER — ACETAMINOPHEN 650 MG RE SUPP: 650.0000 mg | Freq: Four times a day (QID) | RECTAL | Status: DC | PRN

## 2012-06-01 MED ORDER — ONDANSETRON HCL 4 MG/2ML IJ SOLN: 4.0000 mg | Freq: Four times a day (QID) | INTRAMUSCULAR | Status: DC | PRN

## 2012-06-01 MED ORDER — CLINDAMYCIN PHOSPHATE 600 MG/50ML IV SOLN
600.0000 mg | Freq: Four times a day (QID) | INTRAVENOUS | Status: AC
  Administered 2012-06-01 (×2): 600 mg via INTRAVENOUS
  Filled 2012-06-01 (×3): qty 50

## 2012-06-01 MED ORDER — GLYCOPYRROLATE 0.2 MG/ML IJ SOLN
INTRAMUSCULAR | Status: DC | PRN
  Administered 2012-06-01: 0.6 mg via INTRAVENOUS

## 2012-06-01 MED ORDER — AMLODIPINE BESYLATE 5 MG PO TABS
5.0000 mg | ORAL_TABLET | Freq: Every day | ORAL | Status: DC
  Administered 2012-06-02 – 2012-06-03 (×2): 5 mg via ORAL
  Filled 2012-06-01 (×3): qty 1

## 2012-06-01 MED ORDER — HYDROCODONE-ACETAMINOPHEN 5-325 MG PO TABS
1.0000 | ORAL_TABLET | ORAL | Status: DC | PRN
  Administered 2012-06-01: 2 via ORAL
  Administered 2012-06-01: 1 via ORAL
  Administered 2012-06-01 – 2012-06-03 (×9): 2 via ORAL
  Filled 2012-06-01 (×10): qty 2

## 2012-06-01 MED ORDER — LOSARTAN POTASSIUM-HCTZ 50-12.5 MG PO TABS: 0.5000 | ORAL_TABLET | Freq: Every day | ORAL | Status: DC

## 2012-06-01 MED ORDER — HYDROMORPHONE HCL PF 1 MG/ML IJ SOLN
INTRAMUSCULAR | Status: AC
  Filled 2012-06-01: qty 1

## 2012-06-01 MED ORDER — ONDANSETRON HCL 4 MG/2ML IJ SOLN
INTRAMUSCULAR | Status: DC | PRN
  Administered 2012-06-01: 4 mg via INTRAVENOUS

## 2012-06-01 MED ORDER — FLEET ENEMA 7-19 GM/118ML RE ENEM: 1.0000 | ENEMA | Freq: Once | RECTAL | Status: AC | PRN

## 2012-06-01 MED ORDER — ARTIFICIAL TEARS OP OINT
TOPICAL_OINTMENT | OPHTHALMIC | Status: DC | PRN
  Administered 2012-06-01: 1 via OPHTHALMIC

## 2012-06-01 MED ORDER — SODIUM CHLORIDE 0.9 % IR SOLN
Status: DC | PRN
  Administered 2012-06-01: 1000 mL

## 2012-06-01 MED ORDER — ACETAMINOPHEN 325 MG PO TABS: 650.0000 mg | ORAL_TABLET | Freq: Four times a day (QID) | ORAL | Status: DC | PRN

## 2012-06-01 MED ORDER — NICOTINE POLACRILEX 2 MG MT GUM
2.0000 mg | CHEWING_GUM | OROMUCOSAL | Status: DC | PRN
  Filled 2012-06-01: qty 1

## 2012-06-01 MED ORDER — HYDROMORPHONE HCL PF 1 MG/ML IJ SOLN
0.5000 mg | INTRAMUSCULAR | Status: DC | PRN
  Administered 2012-06-01 – 2012-06-02 (×4): 0.5 mg via INTRAVENOUS
  Filled 2012-06-01 (×4): qty 1

## 2012-06-01 MED ORDER — DOCUSATE SODIUM 100 MG PO CAPS
100.0000 mg | ORAL_CAPSULE | Freq: Two times a day (BID) | ORAL | Status: DC
  Administered 2012-06-01 – 2012-06-03 (×5): 100 mg via ORAL
  Filled 2012-06-01 (×5): qty 1

## 2012-06-01 MED ORDER — FENTANYL CITRATE 0.05 MG/ML IJ SOLN
INTRAMUSCULAR | Status: DC | PRN
  Administered 2012-06-01: 100 ug via INTRAVENOUS
  Administered 2012-06-01 (×2): 50 ug via INTRAVENOUS

## 2012-06-01 MED ORDER — ONDANSETRON HCL 4 MG PO TABS: 4.0000 mg | ORAL_TABLET | Freq: Four times a day (QID) | ORAL | Status: DC | PRN

## 2012-06-01 MED ORDER — HYDROCHLOROTHIAZIDE 10 MG/ML ORAL SUSPENSION
6.2500 mg | Freq: Every day | ORAL | Status: DC
  Filled 2012-06-01: qty 1.25

## 2012-06-01 MED ORDER — ALBUMIN HUMAN 5 % IV SOLN
INTRAVENOUS | Status: DC | PRN
  Administered 2012-06-01 (×2): via INTRAVENOUS

## 2012-06-01 MED ORDER — BUPIVACAINE-EPINEPHRINE 0.5% -1:200000 IJ SOLN
INTRAMUSCULAR | Status: DC | PRN
  Administered 2012-06-01: 20 mL

## 2012-06-01 MED ORDER — METOCLOPRAMIDE HCL 10 MG PO TABS: 5.0000 mg | ORAL_TABLET | Freq: Three times a day (TID) | ORAL | Status: DC | PRN

## 2012-06-01 MED ORDER — MENTHOL 3 MG MT LOZG
1.0000 | LOZENGE | OROMUCOSAL | Status: DC | PRN
  Filled 2012-06-01: qty 9

## 2012-06-01 MED ORDER — MIDAZOLAM HCL 5 MG/5ML IJ SOLN
INTRAMUSCULAR | Status: DC | PRN
  Administered 2012-06-01 (×2): 1 mg via INTRAVENOUS

## 2012-06-01 MED ORDER — HYDROCODONE-ACETAMINOPHEN 5-325 MG PO TABS
ORAL_TABLET | ORAL | Status: AC
  Filled 2012-06-01: qty 2

## 2012-06-01 MED ORDER — SENNOSIDES-DOCUSATE SODIUM 8.6-50 MG PO TABS: 1.0000 | ORAL_TABLET | Freq: Every evening | ORAL | Status: DC | PRN

## 2012-06-01 SURGICAL SUPPLY — 66 items
BLADE SAW SAG 73X25 THK (BLADE) ×1
BLADE SAW SGTL 73X25 THK (BLADE) ×1 IMPLANT
BNDG COHESIVE 6X5 TAN STRL LF (GAUZE/BANDAGES/DRESSINGS) ×2 IMPLANT
BRUSH FEMORAL CANAL (MISCELLANEOUS) IMPLANT
CLOTH BEACON ORANGE TIMEOUT ST (SAFETY) ×2 IMPLANT
COVER BACK TABLE 24X17X13 BIG (DRAPES) IMPLANT
COVER SURGICAL LIGHT HANDLE (MISCELLANEOUS) ×4 IMPLANT
DRAPE INCISE IOBAN 66X45 STRL (DRAPES) ×2 IMPLANT
DRAPE ORTHO SPLIT 77X108 STRL (DRAPES) ×2
DRAPE SURG ORHT 6 SPLT 77X108 (DRAPES) ×2 IMPLANT
DRAPE U-SHAPE 47X51 STRL (DRAPES) ×2 IMPLANT
DRSG ADAPTIC 3X8 NADH LF (GAUZE/BANDAGES/DRESSINGS) ×2 IMPLANT
DRSG PAD ABDOMINAL 8X10 ST (GAUZE/BANDAGES/DRESSINGS) ×2 IMPLANT
DURAPREP 26ML APPLICATOR (WOUND CARE) ×2 IMPLANT
ELECT BLADE 4.0 EZ CLEAN MEGAD (MISCELLANEOUS) ×2
ELECT CAUTERY BLADE 6.4 (BLADE) ×2 IMPLANT
ELECT REM PT RETURN 9FT ADLT (ELECTROSURGICAL) ×2
ELECTRODE BLDE 4.0 EZ CLN MEGD (MISCELLANEOUS) ×1 IMPLANT
ELECTRODE REM PT RTRN 9FT ADLT (ELECTROSURGICAL) ×1 IMPLANT
EVACUATOR 1/8 PVC DRAIN (DRAIN) IMPLANT
FACESHIELD LNG OPTICON STERILE (SAFETY) ×6 IMPLANT
GLOVE BIO SURGEON STRL SZ8.5 (GLOVE) ×6 IMPLANT
GLOVE BIOGEL PI IND STRL 8 (GLOVE) ×2 IMPLANT
GLOVE BIOGEL PI INDICATOR 8 (GLOVE) ×2
GLOVE BIOGEL PI ORTHO PRO 7.5 (GLOVE) ×1
GLOVE ORTHO TXT STRL SZ7.5 (GLOVE) IMPLANT
GLOVE PI ORTHO PRO STRL 7.5 (GLOVE) ×1 IMPLANT
GLOVE SURG ORTHO 8.0 STRL STRW (GLOVE) ×4 IMPLANT
GLOVE SURG SS PI 6.5 STRL IVOR (GLOVE) ×4 IMPLANT
GLOVE SURG SS PI 7.5 STRL IVOR (GLOVE) ×2 IMPLANT
GLOVE SURG SS PI 8.5 STRL IVOR (GLOVE) ×1
GLOVE SURG SS PI 8.5 STRL STRW (GLOVE) ×1 IMPLANT
GOWN BRE IMP SLV SIRUS LXLNG (GOWN DISPOSABLE) ×2 IMPLANT
GOWN PREVENTION PLUS XLARGE (GOWN DISPOSABLE) ×2 IMPLANT
GOWN PREVENTION PLUS XXLARGE (GOWN DISPOSABLE) ×2 IMPLANT
GOWN STRL NON-REIN LRG LVL3 (GOWN DISPOSABLE) ×2 IMPLANT
GOWN STRL REIN 2XL XLG LVL4 (GOWN DISPOSABLE) ×4 IMPLANT
HANDPIECE INTERPULSE COAX TIP (DISPOSABLE)
IMMOBILIZER KNEE 20 (SOFTGOODS)
IMMOBILIZER KNEE 20 THIGH 36 (SOFTGOODS) IMPLANT
IMMOBILIZER KNEE 22 UNIV (SOFTGOODS) ×2 IMPLANT
IMMOBILIZER KNEE 24 THIGH 36 (MISCELLANEOUS) IMPLANT
IMMOBILIZER KNEE 24 UNIV (MISCELLANEOUS)
KIT BASIN OR (CUSTOM PROCEDURE TRAY) ×2 IMPLANT
KIT ROOM TURNOVER OR (KITS) ×2 IMPLANT
MANIFOLD NEPTUNE II (INSTRUMENTS) ×2 IMPLANT
NEEDLE 22X1 1/2 (OR ONLY) (NEEDLE) ×2 IMPLANT
NEEDLE MAYO TROCAR (NEEDLE) ×2 IMPLANT
NS IRRIG 1000ML POUR BTL (IV SOLUTION) ×2 IMPLANT
PACK TOTAL JOINT (CUSTOM PROCEDURE TRAY) ×2 IMPLANT
PAD ARMBOARD 7.5X6 YLW CONV (MISCELLANEOUS) ×4 IMPLANT
PRESSURIZER FEMORAL UNIV (MISCELLANEOUS) IMPLANT
SET HNDPC FAN SPRY TIP SCT (DISPOSABLE) IMPLANT
SPONGE GAUZE 4X4 12PLY (GAUZE/BANDAGES/DRESSINGS) ×2 IMPLANT
STAPLER VISISTAT 35W (STAPLE) ×2 IMPLANT
SUCTION FRAZIER TIP 10 FR DISP (SUCTIONS) ×2 IMPLANT
SUT ETHIBOND NAB CT1 #1 30IN (SUTURE) ×8 IMPLANT
SUT VIC AB 1 CTB1 27 (SUTURE) ×4 IMPLANT
SUT VIC AB 2-0 CT1 27 (SUTURE) ×2
SUT VIC AB 2-0 CT1 TAPERPNT 27 (SUTURE) ×2 IMPLANT
SYR CONTROL 10ML LL (SYRINGE) ×2 IMPLANT
TOWEL OR 17X24 6PK STRL BLUE (TOWEL DISPOSABLE) ×2 IMPLANT
TOWEL OR 17X26 10 PK STRL BLUE (TOWEL DISPOSABLE) ×2 IMPLANT
TOWER CARTRIDGE SMART MIX (DISPOSABLE) IMPLANT
TRAY FOLEY CATH 14FR (SET/KITS/TRAYS/PACK) ×2 IMPLANT
WATER STERILE IRR 1000ML POUR (IV SOLUTION) ×2 IMPLANT

## 2012-06-01 NOTE — Interval H&P Note (Signed)
History and Physical Interval Note:  06/01/2012 7:25 AM  Mary Adkins  has presented today for surgery, with the diagnosis of RIGHT HIP: FRACTURE/FEMORAL NECK-CLOSED   The various methods of treatment have been discussed with the patient and family. After consideration of risks, benefits and other options for treatment, the patient has consented to  Procedure(s) with comments: RIGHT TOTAL HIP ARTHROPLASTY WITH AUTOGRAFT (Right) - RIGHT TOTAL HIP REPLACEMENT ANESTHESIA:  GENERAL, PRE/POST OP FEMORAL NERVE as a surgical intervention .  The patient's history has been reviewed, patient examined, no change in status, stable for surgery.  I have reviewed the patient's chart and labs.  Questions were answered to the patient's satisfaction.     CAFFREY JR,W D

## 2012-06-01 NOTE — H&P (View-Only) (Signed)
TOTAL HIP ADMISSION H&P  Patient is admitted for right total hip arthroplasty.  Subjective:  Chief Complaint: right hip pain  HPI: Mary Adkins, 72 y.o. female, has a history of pain and functional disability in the right hip(s) due to femoral neck fracture with underlying OA and patient has failed non-surgical conservative treatments for greater than 6-8 weeks to include NSAID's and/or analgesics, use of assistive devices and activity modification.  Onset of symptoms was abrupt starting 6-8 weeks ago with gradually worsening course since that time.The patient noted no past surgery on the right hip(s).  Patient currently rates pain in the right hip at 8 out of 10 with activity. Patient has worsening of pain with activity and weight bearing, pain that interfers with activities of daily living and pain with passive range of motion. Patient has evidence of joint space narrowing and nondisplaced femoral neck fracture by imaging studies. This condition presents safety issues increasing the risk of falls.   There is no current active infection.  There are no active problems to display for this patient.  Past Medical History  Diagnosis Date  . Family history of anesthesia complication     pt's mother "died during anesthesia but they brought her back"  . Hypertension   . Heart murmur     per pt. & Dr. Franky Macho  . 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 ago, took med.  . Neuromuscular disorder     hip, back  . 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 Breast surgery  Left, benign results   . Tonsillectomy    . Dilation and curettage of uterus    . Back surgery  x3 back surgery     (Not in a hospital admission) Allergies  Allergen Reactions  . Acetazolamide Other (See Comments)    Face swelled and peeled  . Codeine Nausea And Vomiting  . Crestor (Rosuvastatin) Other (See Comments)    Muscle aches  . Diazepam Other (See Comments)     Put her to sleep  . Lipitor (Atorvastatin) Other (See Comments)    Muscle aches  . Prednisone Other (See Comments)    Rapid heart rate  . Penicillins Rash    History  Substance Use Topics  . Smoking status: Former Smoker    Types: Cigarettes    Quit date: 05/04/2011  . Smokeless tobacco: Not on file  . Alcohol Use: Yes     Comment: wine - everyday- one glass     No family history on file.   Review of Systems  Constitutional: Negative.   HENT: Negative.   Eyes: Negative.   Respiratory: Negative.   Cardiovascular: Negative.   Gastrointestinal: Negative.   Genitourinary: Negative.   Musculoskeletal: Positive for joint pain. Negative for falls.  Skin: Negative.   Neurological: Negative.   Endo/Heme/Allergies: Negative.   Psychiatric/Behavioral: Negative.     Objective:  Physical Exam  Constitutional: She is oriented to Adkins, place, and time. She appears well-developed and well-nourished. No distress.  HENT:  Head: Normocephalic and atraumatic.  Nose: Nose normal.  Eyes: Conjunctivae and EOM are normal. Pupils are equal, round, and reactive to light.  Neck: Normal range of motion. Neck supple.  Cardiovascular: Normal rate, regular rhythm and intact distal pulses.   Murmur heard. Respiratory: Effort normal and breath sounds normal. She has no wheezes. She has no rales. She exhibits no tenderness.  GI: Soft. Bowel sounds are normal. She exhibits no distension. There is no tenderness.  Musculoskeletal:       Right hip: She exhibits decreased range of motion, decreased strength, tenderness and bony tenderness. She exhibits no deformity and no laceration.  Lymphadenopathy:    She has no cervical adenopathy.  Neurological: She is alert and oriented to Adkins, place, and time. No cranial nerve deficit.  Skin: Skin is warm and dry. No rash noted. No erythema.  Psychiatric: She has a normal mood and affect. Her behavior is normal.    Vital signs in last 24  hours: @VSRANGES @  Labs:   Estimated body mass index is 26.22 kg/(m^2) as calculated from the following:   Height as of an earlier encounter on 05/31/12: 5\' 6"  (1.676 m).   Weight as of an earlier encounter on 05/31/12: 73.664 kg (162 lb 6.4 oz).   Imaging Review Plain radiographs demonstrate moderate degenerative joint disease of the right hip(s). The bone quality appears to be good for age and reported activity level.  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 The treatment options including medical management, injection therapy, arthroscopy and arthroplasty were discussed at length. The risks and benefits of total hip arthroplasty were presented and reviewed. The risks due to aseptic loosening, infection, stiffness, dislocation/subluxation,  thromboembolic complications and other imponderables were discussed.  The patient acknowledged the explanation, agreed to proceed with the plan and consent was signed. Patient is being admitted for inpatient treatment for surgery, pain control, PT, OT, prophylactic antibiotics, VTE prophylaxis, progressive ambulation and ADL's and discharge planning.The patient is planning to be discharged home with home health services

## 2012-06-01 NOTE — Op Note (Signed)
Mary Adkins, Mary Adkins           ACCOUNT NO.:  [id number]  MEDICAL RECORD NO.:  [id number]  LOCATION:  5N08C                        FACILITY:  [facility]  PHYSICIAN:  Dyke Brackett, M.D.    DATE OF BIRTH:  1940-08-17  DATE OF PROCEDURE: DATE OF DISCHARGE:                              OPERATIVE REPORT   INDICATIONS:  This is a 72 year old, who presented on consultation of neurosurgery with severe leg and groin pain with some apparent issues of right-sided back pathology, but x-rays suggested ongoing stress reaction, degenerative change in the hip, and MRI showed a stress fracture of the femoral neck with an abnormal appearing head.  After consultation with the patient, it was elected to perform a total hip replacement.  PREOPERATIVE DIAGNOSIS:  Stress fracture of the femoral neck with abnormal femoral head, probable osteoarthritis.  POSTOPERATIVE DIAGNOSIS:  Stress fracture of the femoral neck with abnormal femoral head, probable osteoarthritis.  OPERATION:  Right total hip replacement (DePuy AML porous-coated femur 13.5 mm small stature, +8 mm neck length, 36 mm all metal hip ball with 52 mm sector cup with marathon poly.  SURGEON:  Dyke Brackett, M.D.  ASSISTANT:  Margart Sickles, PA-C.  ESTIMATED BLOOD LOSS:  300 mL.  DESCRIPTION OF PROCEDURE:  Sterile prep and drape in the lateral position, posterior approach to the hip made, splitting of the iliotibial band, gluteus maximus.  We split the external rotators.  T- capsulotomy was made in the hip.  Delivered the head, which actually appeared rather degenerative.  We then cut the head about 1 fingerbreadth above the lesser trochanter.  The head was sent to pathology due to the abnormality noted in the indications on the MRI. We then progressively reamed up to a 13 mm size to accept a 13.5 mm stem.  The acetabulum was then identified.  The excess labrum was removed.  We placed wing retractors superiorly and posteriorly.   There were some osteophytes that we removed around protecting the posterior inferior acetabulum.  We then reamed the acetabulum to 51 mm to accept a 52 cup and placed a cup with reaction with about 45 degrees abduction and 15 degrees of anteversion and placed a trial poly and a trial broach and trialed off this and thought that at about a +5 mm neck length, we were relatively stable.  Then we removed the trial liner and the broach, placed apex screw to hold the poly with the offset 10 degree poly seated about the 10 o'clock position.  The final prosthesis was inserted with trialing off that the neck length to eventually be a +8 mm neck length, which really with extreme flexion and internal rotation and adduction, the hip would only dislocate at 90 degrees of internal rotation with full adduction and full flexion.  Final components head was inserted. Wound was irrigated.  We closed the T- capsulotomy with #1 Ethibond.  The iliotibial band and gluteus maximus with running #1 Ethibond, and the subcu tissues with 0 and 2-0, Vicryl skin clips.  Marcaine with epinephrine was instilled into the skin, lightly compressive sterile dressing and knee immobilizer applied, taken to recovery in stable condition.     Dyke Brackett, M.D.  WDC/MEDQ  D:  06/01/2012  T:  06/01/2012  Job:  [id number]

## 2012-06-01 NOTE — Anesthesia Postprocedure Evaluation (Signed)
Anesthesia Post Note  Patient: Mary Adkins  Procedure(s) Performed: Procedure(s) (LRB): RIGHT TOTAL HIP ARTHROPLASTY WITH AUTOGRAFT (Right)  Anesthesia type: general  Patient location: PACU  Post pain: Pain level controlled  Post assessment: Patient's Cardiovascular Status Stable  Last Vitals:  Filed Vitals:   06/01/12 1000  BP:   Pulse: 83  Temp:   Resp: 10    Post vital signs: Reviewed and stable  Level of consciousness: sedated  Complications: No apparent anesthesia complications

## 2012-06-01 NOTE — Progress Notes (Signed)
UR COMPLETED  

## 2012-06-01 NOTE — Anesthesia Preprocedure Evaluation (Addendum)
Anesthesia Evaluation  Patient identified by MRN, date of birth, ID band Patient awake    Reviewed: Allergy & Precautions, H&P , NPO status , Patient's Chart, lab work & pertinent test results  History of Anesthesia Complications Negative for: history of anesthetic complications  Airway Mallampati: II TM Distance: >3 FB Neck ROM: Full    Dental  (+) Teeth Intact and Dental Advisory Given   Pulmonary neg pulmonary ROS,    Pulmonary exam normal       Cardiovascular hypertension, Pt. on medications     Neuro/Psych CVA    GI/Hepatic negative GI ROS, Neg liver ROS,   Endo/Other    Renal/GU negative Renal ROS     Musculoskeletal   Abdominal   Peds  Hematology   Anesthesia Other Findings   Reproductive/Obstetrics                          Anesthesia Physical Anesthesia Plan  ASA: III  Anesthesia Plan: General   Post-op Pain Management:    Induction: Intravenous  Airway Management Planned: Oral ETT  Additional Equipment:   Intra-op Plan:   Post-operative Plan: Extubation in OR  Informed Consent: I have reviewed the patients History and Physical, chart, labs and discussed the procedure including the risks, benefits and alternatives for the proposed anesthesia with the patient or authorized representative who has indicated his/her understanding and acceptance.   Dental advisory given  Plan Discussed with: CRNA, Anesthesiologist and Surgeon  Anesthesia Plan Comments:        Anesthesia Quick Evaluation

## 2012-06-01 NOTE — Transfer of Care (Signed)
Immediate Anesthesia Transfer of Care Note  Patient: Mary Adkins  Procedure(s) Performed: Procedure(s) with comments: RIGHT TOTAL HIP ARTHROPLASTY WITH AUTOGRAFT (Right) - RIGHT TOTAL HIP REPLACEMENT ANESTHESIA:  GENERAL, PRE/POST OP FEMORAL NERVE  Patient Location: PACU  Anesthesia Type:General  Level of Consciousness: awake, oriented, sedated and patient cooperative  Airway & Oxygen Therapy: Patient Spontanous Breathing and Patient connected to nasal cannula oxygen  Post-op Assessment: Report given to PACU RN and Post -op Vital signs reviewed and stable  Post vital signs: Reviewed and stable  Complications: No apparent anesthesia complications and Patient re-intubated

## 2012-06-01 NOTE — Preoperative (Signed)
Beta Blockers   Reason not to administer Beta Blockers:Not Applicable 

## 2012-06-01 NOTE — Brief Op Note (Signed)
06/01/2012  9:41 AM  PATIENT:  Mary Adkins  72 y.o. female  PRE-OPERATIVE DIAGNOSIS:  RIGHT HIP: FRACTURE/FEMORAL NECK-CLOSED   POST-OPERATIVE DIAGNOSIS:  RIGHT HIP: FRACTURE/FEMORAL NECK-  PROCEDURE:  Procedure(s) with comments: RIGHT TOTAL HIP ARTHROPLASTY WITH AUTOGRAFT (Right) - RIGHT TOTAL HIP REPLACEMENT ANESTHESIA:  GENERAL, PRE/POST OP FEMORAL NERVE  SURGEON:  Surgeon(s) and Role:    * W D Carloyn Manner., MD - Primary  PHYSICIAN ASSISTANT:   ASSISTANTS: Margart Sickles, PA-C   ANESTHESIA:   local and general  EBL:  Total I/O In: 1350 [I.V.:1100; IV Piggyback:250] Out: 380 [Urine:80; Blood:300]  BLOOD ADMINISTERED:none  DRAINS: none   LOCAL MEDICATIONS USED:  BUPIVICAINE   SPECIMEN:  Source of Specimen:  femoral head  DISPOSITION OF SPECIMEN:  PATHOLOGY  COUNTS:  YES  TOURNIQUET:  * No tourniquets in log *  DICTATION: .Other Dictation: Dictation Number unknown  PLAN OF CARE: Admit to inpatient   PATIENT DISPOSITION:  PACU - hemodynamically stable.   Delay start of Pharmacological VTE agent (>24hrs) due to surgical blood loss or risk of bleeding: yes

## 2012-06-01 NOTE — Clinical Social Work Note (Signed)
CSW received consult for SNF placement. Patient in room sleeping after OR at time of visit. CSW will continue to follow pending PT evaluation and recommendations.   Lia Foyer, LCSWA [facility] Clinical Social Worker Contact #: [phone]

## 2012-06-01 NOTE — Anesthesia Procedure Notes (Signed)
Procedure Name: Intubation Date/Time: 06/01/2012 7:41 AM Performed by: Marni Griffon Pre-anesthesia Checklist: Patient identified, Emergency Drugs available, Suction available and Patient being monitored Patient Re-evaluated:Patient Re-evaluated prior to inductionOxygen Delivery Method: Circle system utilized Preoxygenation: Pre-oxygenation with 100% oxygen Intubation Type: IV induction Ventilation: Mask ventilation without difficulty and Oral airway inserted - appropriate to patient size Laryngoscope Size: Mac and 3 Grade View: Grade I Tube type: Oral Tube size: 7.5 mm Number of attempts: 1 Airway Equipment and Method: Stylet Placement Confirmation: ETT inserted through vocal cords under direct vision,  positive ETCO2 and breath sounds checked- equal and bilateral Secured at: 22 (cm at teeth) cm Tube secured with: Tape Dental Injury: Teeth and Oropharynx as per pre-operative assessment

## 2012-06-02 ENCOUNTER — Encounter (HOSPITAL_COMMUNITY): Payer: Self-pay | Admitting: Physician Assistant

## 2012-06-02 DIAGNOSIS — M169 Osteoarthritis of hip, unspecified: Secondary | ICD-10-CM | POA: Diagnosis present

## 2012-06-02 DIAGNOSIS — I639 Cerebral infarction, unspecified: Secondary | ICD-10-CM | POA: Diagnosis present

## 2012-06-02 DIAGNOSIS — R011 Cardiac murmur, unspecified: Secondary | ICD-10-CM | POA: Diagnosis present

## 2012-06-02 DIAGNOSIS — G709 Myoneural disorder, unspecified: Secondary | ICD-10-CM | POA: Diagnosis present

## 2012-06-02 DIAGNOSIS — D62 Acute posthemorrhagic anemia: Secondary | ICD-10-CM | POA: Diagnosis not present

## 2012-06-02 DIAGNOSIS — I10 Essential (primary) hypertension: Secondary | ICD-10-CM | POA: Diagnosis present

## 2012-06-02 LAB — BASIC METABOLIC PANEL
CO2: 28 mEq/L (ref 19–32)
Calcium: 8.3 mg/dL — ABNORMAL LOW (ref 8.4–10.5)
Creatinine, Ser: 0.64 mg/dL (ref 0.50–1.10)

## 2012-06-02 LAB — CBC
MCH: 33.6 pg (ref 26.0–34.0)
MCV: 97.9 fL (ref 78.0–100.0)
Platelets: 199 10*3/uL (ref 150–400)
RBC: 2.89 MIL/uL — ABNORMAL LOW (ref 3.87–5.11)
RDW: 12.2 % (ref 11.5–15.5)

## 2012-06-02 MED ORDER — LOSARTAN POTASSIUM 25 MG PO TABS
25.0000 mg | ORAL_TABLET | Freq: Every day | ORAL | Status: DC
  Administered 2012-06-03: 25 mg via ORAL
  Filled 2012-06-02: qty 1

## 2012-06-02 MED ORDER — SENNOSIDES-DOCUSATE SODIUM 8.6-50 MG PO TABS
2.0000 | ORAL_TABLET | Freq: Every day | ORAL | Status: DC
  Administered 2012-06-02: 2 via ORAL
  Filled 2012-06-02: qty 2

## 2012-06-02 MED ORDER — POTASSIUM CHLORIDE CRYS ER 20 MEQ PO TBCR
20.0000 meq | EXTENDED_RELEASE_TABLET | Freq: Two times a day (BID) | ORAL | Status: DC
  Administered 2012-06-02 – 2012-06-03 (×3): 20 meq via ORAL
  Filled 2012-06-02 (×5): qty 1

## 2012-06-02 MED ORDER — ENOXAPARIN SODIUM 40 MG/0.4ML ~~LOC~~ SOLN
40.0000 mg | SUBCUTANEOUS | Status: DC
  Administered 2012-06-02: 40 mg via SUBCUTANEOUS
  Filled 2012-06-02 (×2): qty 0.4

## 2012-06-02 NOTE — Clinical Social Work Note (Signed)
Clinical Social Worker reviewed chart and noticed PT's recommendation for home health at discharge. CSW will sign off, as social work intervention is no longer needed.   Rozetta Nunnery MSW, [employer] [phone]

## 2012-06-02 NOTE — Progress Notes (Signed)
Occupational Therapy Evaluation Patient Details Name: Mary Adkins MRN: [id number] DOB: Feb 07, 1941 Today's Date: 06/02/2012 Time: 5621-3086 OT Time Calculation (min): 24 min  OT Assessment / Plan / Recommendation Clinical Impression  72 yo s/p R THA. Posterior precautions. WBAT. Educated pt on precautions, DME and AE for ADL and functional mobility. Handout given. Pt has all nec DME. Making excellent rogress. Pt will be able to D/C home tomorrow with 24/7 S of family. rec to continue to [facility].    OT Assessment  All further OT needs can be met in the next venue of care    Follow Up Recommendations  Home health OT    Barriers to Discharge  none    Equipment Recommendations  None recommended by OT    Recommendations for Other Services    Frequency    eval only   Precautions / Restrictions Precautions Precautions: Posterior Hip Precaution Booklet Issued: Yes (comment) Restrictions Weight Bearing Restrictions: Yes RLE Weight Bearing: Weight bearing as tolerated   Pertinent Vitals/Pain R hip. 5. Ice applied after session    ADL  Transfers/Ambulation Related to ADLs: minguard ADL Comments: Educated pt on posterior hip precuations and available AE. Pt verbalized understanding. Given handout    OT Diagnosis: Generalized weakness;Acute pain  OT Problem List: Decreased strength;Decreased knowledge of use of DME or AE;Decreased knowledge of precautions;Pain OT Treatment Interventions:     OT Goals Acute Rehab OT Goals OT Goal Formulation:  (eval only)  Visit Information  Last OT Received On: 06/02/12 Assistance Needed: +1    Subjective Data      Prior Functioning     Home Living Lives With: Family Available Help at Discharge: Available PRN/intermittently Type of Home: House Home Access: Stairs to enter [employer] of Steps: 1 Home Layout: One level Bathroom Shower/Tub: Health visitor: Handicapped height Bathroom Accessibility:  No Home Adaptive Equipment: Walker - rolling;Wheelchair - manual;Bedside commode/3-in-1;Shower chair with back;Reacher;Other (comment) (lift chair) Prior Function Level of Independence: Needs assistance;Independent with assistive device(s) Needs Assistance: Meal Prep;Light Housekeeping Meal Prep: Moderate Light Housekeeping: Moderate Able to Take Stairs?: Yes Comments: was independent prior to breaking leg in Oct         Vision/Perception     Cognition  Cognition Overall Cognitive Status: Appears within functional limits for tasks assessed/performed    Extremity/Trunk Assessment Right Upper Extremity Assessment RUE ROM/Strength/Tone: Within functional levels Left Upper Extremity Assessment LUE ROM/Strength/Tone: Within functional levels     Mobility Bed Mobility Bed Mobility: Not assessed Transfers Transfers: Sit to Stand;Stand to Sit Sit to Stand: 4: Min guard;From chair/3-in-1 Stand to Sit: 4: Min guard;To chair/3-in-1 Details for Transfer Assistance: vc for technique and following precautions     Exercise     Balance  WFL   End of Session OT - End of Session Equipment Utilized During Treatment: Gait belt Activity Tolerance: Patient tolerated treatment well Patient left: in chair;with call bell/phone within reach Nurse Communication: Mobility status;Precautions  GO     Amirra Herling,HILLARY 06/02/2012, 11:17 AM Luisa Dago, OTR/L  [phone] 06/02/2012

## 2012-06-02 NOTE — Progress Notes (Signed)
Physical Therapy Treatment Patient Details Name: Mary Adkins MRN: [id number] DOB: Feb 12, 1941 Today's Date: 06/02/2012 Time: 1501-1530 PT Time Calculation (min): 29 min  PT Assessment / Plan / Recommendation Comments on Treatment Session  Grandaughter present for entire session. PT answered questions from pt and family.  Should be ready for DC tomorrow from a mobility standpoint.      Follow Up Recommendations  Home health PT;Other (comment)     Does the patient have the potential to tolerate intense rehabilitation     Barriers to Discharge        Equipment Recommendations  None recommended by PT    Recommendations for Other Services    Frequency 7X/week   Plan Discharge plan remains appropriate    Precautions / Restrictions Precautions Precautions: Posterior Hip Precaution Booklet Issued: Yes (comment) Restrictions Weight Bearing Restrictions: Yes RLE Weight Bearing: Weight bearing as tolerated   Pertinent Vitals/Pain C/o R hip pain with exercise.  Had pain meds about 1 hour prior to session.       Mobility  Bed Mobility Bed Mobility: Supine to Sit;Sit to Supine Supine to Sit: 4: Min assist;HOB elevated;With rails Sit to Supine: 4: Min assist;HOB flat;With rail Details for Bed Mobility Assistance: assist needed for RLE Transfers Transfers: Sit to Stand;Stand to Sit Sit to Stand: 4: Min guard;With upper extremity assist;With armrests;From chair/3-in-1 Stand to Sit: 4: Min guard;With upper extremity assist;With armrests;To chair/3-in-1 Details for Transfer Assistance: vc for technique and following precautions Ambulation/Gait Ambulation/Gait Assistance: 5: Supervision Ambulation Distance (Feet): 25 Feet Assistive device: Rolling walker Ambulation/Gait Assistance Details: pt cold this pm and did not want to ambulate in halls, therefore decreased distance Gait Pattern: Step-to pattern Stairs: Yes Stairs Assistance: 4: Min guard Stair Management Technique:  Forwards;With walker Number of Stairs: 1    Exercises Total Joint Exercises Ankle Circles/Pumps: AROM;Both;5 reps Heel Slides: AAROM;10 reps;Right;Supine Hip ABduction/ADduction: AAROM;Right;10 reps;Supine Long Arc Quad: AROM;Right;10 reps;Seated   PT Diagnosis:    PT Problem List:   PT Treatment Interventions:     PT Goals Acute Rehab PT Goals Pt will go Supine/Side to Sit: with modified independence;with HOB 0 degrees PT Goal: Supine/Side to Sit - Progress: Progressing toward goal Pt will go Sit to Stand: with modified independence PT Goal: Sit to Stand - Progress: Progressing toward goal Pt will Ambulate: 51 - 150 feet;with modified independence;with rolling walker PT Goal: Ambulate - Progress: Progressing toward goal Pt will Go Up / Down Stairs: 1-2 stairs;with supervision;with rolling walker PT Goal: Up/Down Stairs - Progress: Progressing toward goal Pt will Perform Home Exercise Program: with min assist PT Goal: Perform Home Exercise Program - Progress: Progressing toward goal  Visit Information  Last PT Received On: 06/02/12 Assistance Needed: +1    Subjective Data      Cognition  Cognition Overall Cognitive Status: Appears within functional limits for tasks assessed/performed Arousal/Alertness: Awake/alert Orientation Level: Appears intact for tasks assessed Behavior During Session: [facility] for tasks performed    Balance     End of Session PT - End of Session Equipment Utilized During Treatment: Gait belt Activity Tolerance: Patient tolerated treatment well Patient left: in bed;with call bell/phone within reach;with family/visitor present   GP     Donnella Sham 06/02/2012, 3:54 PM Lavona Mound, PT  [phone] 06/02/2012

## 2012-06-02 NOTE — Progress Notes (Signed)
Physical Therapy Evaluation Patient Details Name: Mary Adkins MRN: [id number] DOB: [birth year] Today's Date: 06/02/2012 Time: 4696-2952 PT Time Calculation (min): 18 min  PT Assessment / Plan / Recommendation Clinical Impression  72 yo F s/p R THR with posterior approach.  Presents to PT with some mobility defecits and will benefit from skilled PT.  Pt should progress quickly and she should be on target to DC home tomorrow (her goal).      PT Assessment  Patient needs continued PT services    Follow Up Recommendations  Home health PT;Other (comment) (PRN help at home)    Does the patient have the potential to tolerate intense rehabilitation      Barriers to Discharge        Equipment Recommendations  None recommended by PT    Recommendations for Other Services     Frequency 7X/week    Precautions / Restrictions Precautions Precautions: Posterior Hip Precaution Booklet Issued: Yes (comment) Precaution Comments: pt able to state 2/3 hip precautions and used her handout to remember the third Restrictions Weight Bearing Restrictions: Yes RLE Weight Bearing: Weight bearing as tolerated   Pertinent Vitals/Pain 4/10 in right hip      Mobility  Bed Mobility Bed Mobility: Not assessed Details for Bed Mobility Assistance:  (pt up in chair upon PT arrival) Transfers Transfers: Sit to Stand;Stand to Sit Sit to Stand: 4: Min guard;With upper extremity assist;With armrests;From chair/3-in-1 Stand to Sit: 4: Min guard;With upper extremity assist;With armrests;To chair/3-in-1 Details for Transfer Assistance: vc for technique and following precautions Ambulation/Gait Ambulation/Gait Assistance: 5: Supervision Ambulation Distance (Feet): 50 Feet Assistive device: Rolling walker Gait Pattern: Step-to pattern    Exercises Total Joint Exercises Ankle Circles/Pumps: AROM;Both;5 reps   PT Diagnosis: Difficulty walking  PT Problem List: Decreased strength;Decreased  mobility;Decreased knowledge of use of DME;Decreased knowledge of precautions;Pain PT Treatment Interventions: DME instruction;Gait training;Stair training;Therapeutic exercise;Patient/family education   PT Goals Acute Rehab PT Goals PT Goal Formulation: With patient Time For Goal Achievement: 06/05/12 Potential to Achieve Goals: Good Pt will go Supine/Side to Sit: with modified independence;with HOB 0 degrees PT Goal: Supine/Side to Sit - Progress: Goal set today Pt will go Sit to Stand: with modified independence PT Goal: Sit to Stand - Progress: Goal set today Pt will Ambulate: 51 - 150 feet;with modified independence;with rolling walker PT Goal: Ambulate - Progress: Goal set today Pt will Go Up / Down Stairs: 1-2 stairs;with supervision;with rolling walker PT Goal: Up/Down Stairs - Progress: Goal set today Pt will Perform Home Exercise Program: with min assist PT Goal: Perform Home Exercise Program - Progress: Goal set today  Visit Information  Last PT Received On: 06/02/12 Assistance Needed: +1    Subjective Data  Subjective: I've already walked this morning Patient Stated Goal: to go home tomorrow   Prior Functioning  Home Living Lives With: Family Available Help at Discharge: Available PRN/intermittently Type of Home: House Home Access: Stairs to enter Secretary/administrator of Steps: 1 Entrance Stairs-Rails: None Home Layout: One level Bathroom Shower/Tub: Health visitor: Handicapped height Bathroom Accessibility: No Home Adaptive Equipment: Walker - rolling;Wheelchair - manual;Bedside commode/3-in-1;Shower chair with back;Reacher;Other (comment) Prior Function Level of Independence: Needs assistance;Independent with assistive device(s) (for mobility.Has been using RW for about 8 weeks due to pain) Needs Assistance: Meal Prep;Light Housekeeping Meal Prep: Moderate Light Housekeeping: Moderate Able to Take Stairs?: Yes Comments: was independent  prior to breaking leg in Oct Communication Communication: No difficulties    Cognition  Cognition  Overall Cognitive Status: Appears within functional limits for tasks assessed/performed Arousal/Alertness: Awake/alert Orientation Level: Appears intact for tasks assessed Behavior During Session: [facility] for tasks performed    Extremity/Trunk Assessment Right Upper Extremity Assessment RUE ROM/Strength/Tone: Within functional levels Left Upper Extremity Assessment LUE ROM/Strength/Tone: Within functional levels Right Lower Extremity Assessment RLE ROM/Strength/Tone: Deficits;Due to precautions;Due to pain RLE ROM/Strength/Tone Deficits: weakness and ROM limitations for hip Left Lower Extremity Assessment LLE ROM/Strength/Tone: [facility] for tasks assessed Trunk Assessment Trunk Assessment: Normal   Balance    End of Session PT - End of Session Equipment Utilized During Treatment: Gait belt Activity Tolerance: Patient tolerated treatment well Patient left: in chair;with call bell/phone within reach  GP     Donnella Sham 06/02/2012, 12:07 PM Lavona Mound, PT  [phone] 06/02/2012

## 2012-06-02 NOTE — Progress Notes (Addendum)
Patient ID: Mary Adkins, female   DOB: 1940-03-29, 72 y.o.   MRN: [id number] PATIENT ID: Mary Adkins  MRN: [id number]  DOB/AGE:  [birth year] / 72 y.o.  1 Day Post-Op Procedure(s) (LRB): RIGHT TOTAL HIP ARTHROPLASTY WITH AUTOGRAFT (Right)    PROGRESS NOTE Subjective: Patient is alert, oriented,no Nausea, no Vomiting, yes passing gas, no Bowel Movement. Taking PO well. Denies SOB, Chest or Calf Pain. Using Incentive Spirometer, PAS in place. Ambulate well with me this am Patient reports pain as 3 on 0-10 scale  .    Objective: Vital signs in last 24 hours: Filed Vitals:   06/02/12 0000 06/02/12 0331 06/02/12 0400 06/02/12 0614  BP:  106/58  111/50  Pulse:  91  86  Temp:  98 F (36.7 C)  98.5 F (36.9 C)  TempSrc:  Oral  Oral  Resp: 16 16 16 16   SpO2:  96%  95%      Intake/Output from previous day: I/O last 3 completed shifts: In: 3155 [I.V.:2605; IV Piggyback:550] Out: 1580 [Urine:1280; Blood:300]   Intake/Output this shift:     LABORATORY DATA:  Recent Labs  05/31/12 1258 06/01/12 1040 06/02/12 0631  WBC 5.1 7.6 5.5  HGB 15.5* 12.0 9.7*  HCT 44.6 34.6* 28.3*  PLT 246 223 199  NA 136 140 132*  K 3.8 4.1 3.4*  CL 96 100 96  CO2 30 31 28   BUN 9 12 8   CREATININE 0.70 0.72 0.64  GLUCOSE 103* 118* 121*  INR 0.90  --   --   CALCIUM 9.9 8.8 8.3*    Examination: ABD soft Neurovascular intact Sensation intact distally Intact pulses distally Dorsiflexion/Plantar flexion intact Incision: moderate drainage and moderate drainage.  1 cm blister from adhesive tape holding on old dressing.  wound and blister covered with mepelex boarder} XR AP&Lat of hip shows well placed\fixed THA  Assessment:   1 Day Post-Op Procedure(s) (LRB): RIGHT TOTAL HIP ARTHROPLASTY WITH AUTOGRAFT (Right) ADDITIONAL DIAGNOSIS:  Principal Problem:   DJD (degenerative joint disease) of hip Active Problems:   Hypertension   Heart murmur   Stroke   Lumbosacral disc disease  Neuromuscular disorder   Postoperative anemia due to acute blood loss   Hypokalemia   Plan: PT/OT WBAT, THA  posterior precautions  DVT Prophylaxis: SCDx72 hrs, ASA 325 mg BID x 2 weeks  DISCHARGE PLAN: Home  DISCHARGE NEEDS: HHPT, Walker and 3-in-1 comode seat

## 2012-06-03 LAB — CBC
MCV: 97.1 fL (ref 78.0–100.0)
Platelets: 201 10*3/uL (ref 150–400)
RDW: 12.1 % (ref 11.5–15.5)
WBC: 6.5 10*3/uL (ref 4.0–10.5)

## 2012-06-03 LAB — BASIC METABOLIC PANEL
Calcium: 8.6 mg/dL (ref 8.4–10.5)
Chloride: 95 mEq/L — ABNORMAL LOW (ref 96–112)
Creatinine, Ser: 0.63 mg/dL (ref 0.50–1.10)
GFR calc Af Amer: >90 mL/min (ref >90)
GFR calc non Af Amer: 89 mL/min — ABNORMAL LOW (ref >90)

## 2012-06-03 MED ORDER — ENOXAPARIN SODIUM 40 MG/0.4ML ~~LOC~~ SOLN: 40.0000 mg | SUBCUTANEOUS | Status: DC

## 2012-06-03 MED ORDER — HYDROCODONE-ACETAMINOPHEN 5-325 MG PO TABS: ORAL_TABLET | ORAL | Status: DC

## 2012-06-03 MED ORDER — BISACODYL 5 MG PO TBEC: DELAYED_RELEASE_TABLET | ORAL | Status: DC

## 2012-06-03 MED ORDER — DSS 100 MG PO CAPS: ORAL_CAPSULE | ORAL | Status: DC

## 2012-06-03 NOTE — Discharge Summary (Signed)
Patient ID: Mary Adkins MRN: [id number] DOB/AGE: 71-Nov-1942 71 y.o.  Admit date: 06/01/2012 Discharge date: 06/03/2012  Admission Diagnoses:  Principal Problem:   DJD (degenerative joint disease) of hip Active Problems:   Hypertension   Heart murmur   Stroke   Lumbosacral disc disease   Neuromuscular disorder   Postoperative anemia due to acute blood loss   Hypokalemia   Discharge Diagnoses:  Same  Past Medical History  Diagnosis Date  . Family history of anesthesia complication     pt's mother "died during anesthesia but they brought her back"  . Hypertension   . Heart murmur     per pt. & Dr. Franky Macho  . 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 ago, took med.  . Neuromuscular disorder     hip, back  . 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 Lumbosacral disc disease     Dr Franky Macho  . DJD (degenerative joint disease) of hip     right    Surgeries: Procedure(s): RIGHT TOTAL HIP ARTHROPLASTY WITH AUTOGRAFT on 06/01/2012   Consultants:    Discharged Condition: Improved  Hospital Course: Mary Adkins is an 72 y.o. female who was admitted 06/01/2012 for operative treatment ofDJD (degenerative joint disease) of hip. Patient has severe unremitting pain that affects sleep, daily activities, and work/hobbies. After pre-op clearance the patient was taken to the operating room on 06/01/2012 and underwent  Procedure(s): RIGHT TOTAL HIP ARTHROPLASTY WITH AUTOGRAFT.    Patient was given perioperative antibiotics: Anti-infectives   Start     Dose/Rate Route Frequency Ordered Stop   06/01/12 1530  clindamycin (CLEOCIN) IVPB 600 mg     600 mg 100 mL/hr over 30 Minutes Intravenous Every 6 hours 06/01/12 1427 06/01/12 2251   06/01/12 0645  clindamycin (CLEOCIN) IVPB 900 mg     900 mg 100 mL/hr over 30 Minutes Intravenous On call to O.R. 06/01/12 4098 06/01/12 0745   06/01/12 0600  ceFAZolin (ANCEF) IVPB 2 g/50 mL premix  Status:  Discontinued     2 g 100 mL/hr over 30  Minutes Intravenous On call to O.R. 05/31/12 1428 06/01/12 1418       Patient was given sequential compression devices, early ambulation, and chemoprophylaxis to prevent DVT.  Patient benefited maximally from hospital stay and there were no complications.    Recent vital signs: Patient Vitals for the past 24 hrs:  BP Temp Temp src Pulse Resp SpO2  06/03/12 0642 113/56 mmHg 98.7 F (37.1 C) Oral 101 18 95 %  06/03/12 0334 - 99.1 F (37.3 C) Oral - - -  06/02/12 2048 107/45 mmHg 100.2 F (37.9 C) Oral 112 18 93 %  06/02/12 1432 122/61 mmHg 98.7 F (37.1 C) - 93 18 96 %     Recent laboratory studies:  Recent Labs  05/31/12 1258 06/01/12 1040 06/02/12 0631 06/03/12 0549  WBC 5.1 7.6 5.5 6.5  HGB 15.5* 12.0 9.7* 9.3*  HCT 44.6 34.6* 28.3* 26.8*  PLT 246 223 199 201  NA 136 140 132*  --   K 3.8 4.1 3.4*  --   CL 96 100 96  --   CO2 30 31 28   --   BUN 9 12 8   --   CREATININE 0.70 0.72 0.64  --   GLUCOSE 103* 118* 121*  --   INR 0.90  --   --   --   CALCIUM 9.9 8.8 8.3*  --  Discharge Medications:     Medication List    STOP taking these medications       aspirin EC 81 MG tablet     oxyCODONE-acetaminophen 5-325 MG per tablet  Commonly known as:  PERCOCET/ROXICET      TAKE these medications       amLODipine 5 MG tablet  Commonly known as:  NORVASC  Take 5 mg by mouth daily before breakfast.     bisacodyl 5 MG EC tablet  Commonly known as:  DULCOLAX  Take 2 tablets every night with dinner until bowel movement.  LAXITIVE.  Restart if two days since last bowel movement     cholecalciferol 1000 UNITS tablet  Commonly known as:  VITAMIN D  Take 1,000 Units by mouth daily.     DSS 100 MG Caps  1 tab 2 times a day while on narcotics.  STOOL SOFTENER     enoxaparin 40 MG/0.4ML injection  Commonly known as:  LOVENOX  Inject 0.4 mLs (40 mg total) into the skin daily.     HYDROcodone-acetaminophen 5-325 MG per tablet  Commonly known as:  NORCO/VICODIN   1-2 tablets every 4-6 hrs as needed for pain     losartan-hydrochlorothiazide 50-12.5 MG per tablet  Commonly known as:  HYZAAR  Take 0.5 tablets by mouth daily before breakfast.     nicotine polacrilex 2 MG gum  Commonly known as:  NICORETTE  Take 2 mg by mouth as needed for smoking cessation. About 8 times a day on the days she takes it        Diagnostic Studies: Dg Chest 2 View  05/31/2012  *RADIOLOGY REPORT*  Clinical Data: Preoperative evaluation prior right total hip arthroplasty.  CHEST - 2 VIEW  Comparison: Chest x-ray 01/30/2009.  Findings: Linear bibasilar opacities may reflect areas of subsegmental atelectasis and/or scarring.  No acute consolidative airspace disease.  No pleural effusions.  No evidence of pulmonary edema.  Heart size is normal.  Mediastinal contours are unremarkable.  Atherosclerosis of the thoracic aorta.  There is a bony prominence that projects over the posterolateral aspect of the left sixth rib which could either represent a healing rib fracture, or any bony lesion in the adjacent scapula (such as an osteochondroma).  IMPRESSION: 1.  No radiographic evidence of acute cardiopulmonary disease. 2.  New bibasilar subsegmental atelectasis and/or scarring. 3.  Area of bony prominence projecting over the posterolateral aspect of the left sixth rib may either represent a healing rib fracture or a lesion in the left scapula such as an osteochondroma.   Original Report Authenticated By: Trudie Reed, M.D.    Mr Lumbar Spine Wo Contrast  05/15/2012  *RADIOLOGY REPORT*  Clinical Data: Back pain with weakness and numbness in the right leg.  Prior vertebroplasty in October, 2013  MRI LUMBAR SPINE WITHOUT CONTRAST  Technique:  Multiplanar and multiecho pulse sequences of the lumbar spine were obtained without intravenous contrast.  Comparison: Multiple exams, including 02/07/2009  Findings: The lowest full intervertebral disk space is labeled L5- S1.  If procedural  intervention is to be performed, careful correlation with this numbering strategy is recommended.  The conus medullaris appears unremarkable.  Conus level:  T12 L4 compression fracture noted with residual marrow edema. Methacrylate noted in the L4 compression fracture and extending into the L3-4 intervertebral disc space.  There is low-level edema in the L4 vertebral body, without further settling of the vertebral body compared to 04/23/2012.  There is 3 mm of posterior subluxation  and / or posterior retropulsion between L3 and L4. Degenerative endplate findings node along the inferior endplate of L3, and at the L5-S1 level.  Prior left laminectomy at L5.  Possible prior right laminectomy at L2. Additional findings at individual levels are as follows:  L1-2:  No impingement.  Mild disc bulge.  L2-3:  No impingement.  Disc bulge with right inferior foraminal disc protrusion.  L3-4:  Marked central stenosis due to the subluxations/mild retropulsion, diffuse disc bulge, and mild prominence of dorsal lumbar epidural adipose tissues.  Right foraminal and lateral extraforaminal disc protrusion abuts the right L3 nerve in the lateral extraforaminal space.  Mild bilateral subarticular lateral recess stenosis.  Cross-sectional area of the thecal sac at this level is 0.7 cm^2, with the AP diameter of the thecal sac at 5 mm.  L4-5:  Mild central stenosis due to facet arthropathy and diffuse disc bulge.  Small bilateral facet effusions.  L5-S1:  No overt impingement.  Prior left laminectomy.  Diffuse disc bulge with bilateral foraminal intervertebral spurring.  IMPRESSION:  1.  Marked central stenosis and mild bilateral subarticular lateral recess stenosis at L3-4 due to subluxation at L3-4, mild bony retropulsion, and to diffuse disc bulge. 2.  Mild central stenosis at L4-5 as detailed above.   Original Report Authenticated By: Gaylyn Rong, M.D.    Dg Pelvis Portable  06/01/2012  *RADIOLOGY REPORT*  Clinical Data:  Postop right hip.  PORTABLE PELVIS,PORTABLE RIGHT HIP - 1 VIEW  Comparison: 05/29/2012.MR.  Findings: Post total right hip replacement.  Acetabular component appears to be angled slightly anteriorly. No periprosthetic fracture noted.  IMPRESSION: Post total right hip replacement.  Acetabular component appears to be angled slightly anteriorly. No periprosthetic fracture noted.   Original Report Authenticated By: Lacy Duverney, M.D.    Mr Hip Right Wo Contrast  05/29/2012  *RADIOLOGY REPORT*  Clinical Data: Severe right hip and groin pain.  MRI OF THE RIGHT HIP WITHOUT CONTRAST  Technique:  Multiplanar, multisequence MR imaging was performed. No intravenous contrast was administered.  Comparison: CT abdomen and pelvis 02/02/2009. MRI lumbar spine 05/14/2012.  Findings: The patient has a large right hip joint effusion.  There is marked increased T2 signal in the superior margin the right femoral head with signal traversing the base of the right femoral neck and extending into the proximal femoral diaphysis identified. The left hip demonstrates normal signal.  There is degenerative change about both hips which appears mild to moderate.  The patient has an L4 compression fracture which is seen on the prior MRI. Imaged intrapelvic contents demonstrate some sigmoid diverticular disease.  IMPRESSION:  1.  Findings compatible with acute fracture of the base the right femoral neck extending into the lesser trochanter with associated marrow edema and large hip joint effusion. 2.  Old L4 compression fracture. Critical Value/emergent results were called by telephone at the time of interpretation on 05/29/2012 at 4:35 p.m. to [place], [place] at [facility], who verbally acknowledged these results.   Original Report Authenticated By: Holley Dexter, M.D.    Dg Hip Portable 1 View Right  06/01/2012  *RADIOLOGY REPORT*  Clinical Data: Postop right hip.  PORTABLE PELVIS,PORTABLE RIGHT HIP - 1 VIEW  Comparison: 05/29/2012.MR.   Findings: Post total right hip replacement.  Acetabular component appears to be angled slightly anteriorly. No periprosthetic fracture noted.  IMPRESSION: Post total right hip replacement.  Acetabular component appears to be angled slightly anteriorly. No periprosthetic fracture noted.   Original Report Authenticated By: Lacy Duverney,  M.D.     Disposition:       Discharge Orders   Future Orders Complete By Expires     Call MD / Call 911  As directed     Comments:      If you experience chest pain or shortness of breath, CALL 911 and be transported to the hospital emergency room.  If you develope a fever above 101 F, pus (white drainage) or increased drainage or redness at the wound, or calf pain, call your surgeon's office.    Change dressing  As directed     Comments:      Change the dressing daily with sterile 4 x 4 inch gauze dressing and apply TED hose.  You may clean the incision with alcohol prior to redressing.    Constipation Prevention  As directed     Comments:      Drink plenty of fluids.  Prune juice may be helpful.  You may use a stool softener, such as Colace (over the counter) 100 mg twice a day.  Use MiraLax (over the counter) for constipation as needed.    Diet - low sodium heart healthy  As directed     Discharge instructions  As directed     Follow the hip precautions as taught in Physical Therapy  As directed     Increase activity slowly as tolerated  As directed     TED hose  As directed     Comments:      Use stockings (TED hose) for 2 weeks on both leg(s).  You may remove them at night for sleeping.       Follow-up Information   Schedule an appointment as soon as possible for a visit with CAFFREY JR,W D, MD. (to be seen on 06/14/12)    Contact information:   [address] [place] [address] [phone]        Signed: Pascal Lux 06/03/2012, 8:33 AM

## 2012-06-03 NOTE — Progress Notes (Signed)
Physical Therapy Treatment Patient Details Name: Mary Adkins MRN: [id number] DOB: Aug 31, 1940 Today's Date: 06/03/2012 Time: 7829-5621 PT Time Calculation (min): 11 min  PT Assessment / Plan / Recommendation Comments on Treatment Session  Pt progressing toward goals, ready for discharge today.    Follow Up Recommendations  Home health PT;Supervision - Intermittent           Equipment Recommendations  None recommended by PT          Plan Discharge plan remains appropriate;Frequency remains appropriate    Precautions / Restrictions Precautions Precautions: Posterior Hip Precaution Comments: pt able to recall 2-3 precautions without cues/assistance. with question cue recalled the third precaution. Restrictions Weight Bearing Restrictions: Yes RLE Weight Bearing: Weight bearing as tolerated       Mobility  Bed Mobility Bed Mobility: Not assessed Ambulation/Gait Stairs Assistance Details (indicate cue type and reason): pt able to verbally recall correct technique.    Exercises Total Joint Exercises Ankle Circles/Pumps: AROM;Both;10 reps;Supine Quad Sets: AROM;Strengthening;Both;10 reps;Supine Short Arc Quad: AROM;Strengthening;Right;10 reps;Supine Heel Slides: AAROM;Strengthening;Right;10 reps;Supine Hip ABduction/ADduction: AAROM;Strengthening;Right;10 reps;Supine    PT Goals Acute Rehab PT Goals Pt will Perform Home Exercise Program: with min assist PT Goal: Perform Home Exercise Program - Progress: Met  Visit Information  Last PT Received On: 06/03/12 Assistance Needed: +1    Subjective Data  Subjective: I walked with the PA this morning and just got back into bed. Agreeable to exercises.   Cognition  Cognition Overall Cognitive Status: Appears within functional limits for tasks assessed/performed Arousal/Alertness: Awake/alert Orientation Level: Appears intact for tasks assessed Behavior During Session: [facility] for tasks performed       End of Session  PT - End of Session Activity Tolerance: Patient tolerated treatment well Patient left: in bed;with call bell/phone within reach   GP     Sallyanne Kuster 06/03/2012, 9:42 AM  Sallyanne Kuster, PTA Office- [phone]

## 2012-06-04 ENCOUNTER — Encounter (HOSPITAL_COMMUNITY): Payer: Self-pay | Admitting: Orthopedic Surgery

## 2012-06-04 NOTE — Progress Notes (Signed)
   CARE MANAGEMENT NOTE 06/04/2012  Patient:  Mary Adkins, Mary Adkins   Account Number:  [id number]  Date Initiated:  06/04/2012  Documentation initiated by:  [facility]  Subjective/Objective Assessment:     Action/Plan:   Anticipated DC Date:  06/04/2012   Anticipated DC Plan:  HOME W HOME HEALTH SERVICES      DC Planning Services  DC out of service area      [facility] Choice  HOME HEALTH   Choice offered to / List presented to:  C-1 Patient        HH arranged  HH-2 PT      HH agency  [facility]   Status of service:  Completed, signed off Medicare Important Message given?   (If response is "NO", the following Medicare IM given date fields will be blank) Date Medicare IM given:   Date Additional Medicare IM given:    Discharge Disposition:  HOME W HOME HEALTH SERVICES  Per UR Regulation:    If discussed at Long Length of Stay Meetings, dates discussed:    Comments:  06/04/2012 1430 NCM contacted pt and offered choice for [facility]. Pt agreeable to [facility] for [facility]. Contacted [facility] rep and the can do soc 3/18. Faxed referral to [employer]. Isidoro Donning RN CCM Case Mgmt phone [phone]

## 2012-06-07 ENCOUNTER — Ambulatory Visit
Admission: RE | Admit: 2012-06-07 | Discharge: 2012-06-07 | Disposition: A | Discharge status: Home / Self Care | Payer: Medicare PPO | Source: Ambulatory Visit | Attending: Orthopedic Surgery | Admitting: Orthopedic Surgery

## 2012-06-07 ENCOUNTER — Other Ambulatory Visit: Payer: Self-pay | Admitting: Orthopedic Surgery

## 2013-03-21 HISTORY — PX: JOINT REPLACEMENT: SHX530

## 2014-09-08 DIAGNOSIS — Z791 Long term (current) use of non-steroidal anti-inflammatories (NSAID): Secondary | ICD-10-CM | POA: Diagnosis not present

## 2014-09-08 DIAGNOSIS — M1990 Unspecified osteoarthritis, unspecified site: Secondary | ICD-10-CM | POA: Diagnosis not present

## 2014-09-08 DIAGNOSIS — I10 Essential (primary) hypertension: Secondary | ICD-10-CM | POA: Diagnosis not present

## 2014-09-11 ENCOUNTER — Encounter (HOSPITAL_COMMUNITY): Payer: Self-pay | Admitting: Emergency Medicine

## 2014-09-11 ENCOUNTER — Emergency Department (HOSPITAL_COMMUNITY)
Admission: EM | Admit: 2014-09-11 | Discharge: 2014-09-11 | Disposition: A | Discharge status: Home / Self Care | Payer: Medicare PPO | Attending: Emergency Medicine | Admitting: Emergency Medicine

## 2014-09-11 DIAGNOSIS — Z880 Allergy status to penicillin: Secondary | ICD-10-CM | POA: Insufficient documentation

## 2014-09-11 DIAGNOSIS — I10 Essential (primary) hypertension: Secondary | ICD-10-CM | POA: Insufficient documentation

## 2014-09-11 DIAGNOSIS — R55 Syncope and collapse: Secondary | ICD-10-CM | POA: Diagnosis not present

## 2014-09-11 DIAGNOSIS — Z79899 Other long term (current) drug therapy: Secondary | ICD-10-CM | POA: Diagnosis not present

## 2014-09-11 DIAGNOSIS — N289 Disorder of kidney and ureter, unspecified: Secondary | ICD-10-CM | POA: Insufficient documentation

## 2014-09-11 DIAGNOSIS — Z8673 Personal history of transient ischemic attack (TIA), and cerebral infarction without residual deficits: Secondary | ICD-10-CM | POA: Insufficient documentation

## 2014-09-11 DIAGNOSIS — Z87891 Personal history of nicotine dependence: Secondary | ICD-10-CM | POA: Diagnosis not present

## 2014-09-11 DIAGNOSIS — Z8669 Personal history of other diseases of the nervous system and sense organs: Secondary | ICD-10-CM | POA: Diagnosis not present

## 2014-09-11 DIAGNOSIS — R011 Cardiac murmur, unspecified: Secondary | ICD-10-CM | POA: Diagnosis not present

## 2014-09-11 DIAGNOSIS — Z8739 Personal history of other diseases of the musculoskeletal system and connective tissue: Secondary | ICD-10-CM | POA: Insufficient documentation

## 2014-09-11 DIAGNOSIS — R031 Nonspecific low blood-pressure reading: Secondary | ICD-10-CM | POA: Diagnosis not present

## 2014-09-11 DIAGNOSIS — R42 Dizziness and giddiness: Secondary | ICD-10-CM | POA: Diagnosis present

## 2014-09-11 LAB — CBC WITH DIFFERENTIAL/PLATELET
BASOS ABS: 0 10*3/uL (ref 0.0–0.1)
BASOS PCT: 0 % (ref 0–1)
EOS ABS: 0.1 10*3/uL (ref 0.0–0.7)
Eosinophils Relative: 1 % (ref 0–5)
HCT: 38.5 % (ref 36.0–46.0)
Hemoglobin: 13 g/dL (ref 12.0–15.0)
Lymphocytes Relative: 15 % (ref 12–46)
Lymphs Abs: 1.5 10*3/uL (ref 0.7–4.0)
MCH: 30.7 pg (ref 26.0–34.0)
MCHC: 33.8 g/dL (ref 30.0–36.0)
MCV: 91 fL (ref 78.0–100.0)
Monocytes Absolute: 0.5 10*3/uL (ref 0.1–1.0)
Monocytes Relative: 5 % (ref 3–12)
NEUTROS PCT: 79 % — AB (ref 43–77)
Neutro Abs: 8.2 10*3/uL — ABNORMAL HIGH (ref 1.7–7.7)
PLATELETS: 258 10*3/uL (ref 150–400)
RBC: 4.23 MIL/uL (ref 3.87–5.11)
RDW: 12.8 % (ref 11.5–15.5)
WBC: 10.3 10*3/uL (ref 4.0–10.5)

## 2014-09-11 LAB — COMPREHENSIVE METABOLIC PANEL
ALBUMIN: 4.2 g/dL (ref 3.5–5.0)
ALK PHOS: 48 U/L (ref 38–126)
ALT: 19 U/L (ref 14–54)
ANION GAP: 9 (ref 5–15)
AST: 19 U/L (ref 15–41)
BUN: 15 mg/dL (ref 6–20)
CHLORIDE: 99 mmol/L — AB (ref 101–111)
CO2: 29 mmol/L (ref 22–32)
CREATININE: 1.25 mg/dL — AB (ref 0.44–1.00)
Calcium: 9 mg/dL (ref 8.9–10.3)
GFR calc Af Amer: 48 mL/min — ABNORMAL LOW (ref >60)
GFR, EST NON AFRICAN AMERICAN: 42 mL/min — AB (ref >60)
GLUCOSE: 110 mg/dL — AB (ref 65–99)
Potassium: 3.6 mmol/L (ref 3.5–5.1)
Sodium: 137 mmol/L (ref 135–145)
Total Bilirubin: 0.6 mg/dL (ref 0.3–1.2)
Total Protein: 7 g/dL (ref 6.5–8.1)

## 2014-09-11 LAB — I-STAT CG4 LACTIC ACID, ED: Lactic Acid, Venous: 0.63 mmol/L (ref 0.5–2.0)

## 2014-09-11 LAB — I-STAT TROPONIN, ED: Troponin i, poc: 0 ng/mL (ref 0.00–0.08)

## 2014-09-11 LAB — CBG MONITORING, ED: Glucose-Capillary: 142 mg/dL — ABNORMAL HIGH (ref 65–99)

## 2014-09-11 LAB — LIPASE, BLOOD: Lipase: 34 U/L (ref 22–51)

## 2014-09-11 LAB — TROPONIN I

## 2014-09-11 MED ORDER — SODIUM CHLORIDE 0.9 % IV SOLN
1000.0000 mL | Freq: Once | INTRAVENOUS | Status: AC
  Administered 2014-09-11: 1000 mL via INTRAVENOUS

## 2014-09-11 MED ORDER — SODIUM CHLORIDE 0.9 % IV SOLN
1000.0000 mL | INTRAVENOUS | Status: DC
  Administered 2014-09-11: 1000 mL via INTRAVENOUS

## 2014-09-11 NOTE — ED Notes (Signed)
Pt stable, ambulatory, states understanding of discharge instructions 

## 2014-09-11 NOTE — ED Notes (Signed)
Per ems-- pt reports acute onset of dizziness and abdominal pain. Upon ems arrival pt pale/diaphoretic. HR -40 bp 70/46. Pt given 650 NS pta.

## 2014-09-11 NOTE — ED Notes (Signed)
Pt CBG, 142. Nurse was notified.

## 2014-09-11 NOTE — Discharge Instructions (Signed)
Your creatinine (blood test of your kidney) was slightly elevated today at 1.25. Normal is up to 1.0, and your creatinine two years ago was 0.65. Please check with your doctor to see if this is a recent change for you.  Vasovagal Syncope, Adult Syncope, commonly known as fainting, is a temporary loss of consciousness. It occurs when the blood flow to the brain is reduced. Vasovagal syncope (also called neurocardiogenic syncope) is a fainting spell in which the blood flow to the brain is reduced because of a sudden drop in heart rate and blood pressure. Vasovagal syncope occurs when the brain and the cardiovascular system (blood vessels) do not adequately communicate and respond to each other. This is the most common cause of fainting. It often occurs in response to fear or some other type of emotional or physical stress. The body has a reaction in which the heart starts beating too slowly or the blood vessels expand, reducing blood pressure. This type of fainting spell is generally considered harmless. However, injuries can occur if a person takes a sudden fall during a fainting spell.  CAUSES  Vasovagal syncope occurs when a person's blood pressure and heart rate decrease suddenly, usually in response to a trigger. Many things and situations can trigger an episode. Some of these include:   Pain.   Fear.   The sight of blood or medical procedures, such as blood being drawn from a vein.   Common activities, such as coughing, swallowing, stretching, or going to the bathroom.   Emotional stress.   Prolonged standing, especially in a warm environment.   Lack of sleep or rest.   Prolonged lack of food.   Prolonged lack of fluids.   Recent illness.  The use of certain drugs that affect blood pressure, such as cocaine, alcohol, marijuana, inhalants, and opiates.  SYMPTOMS  Before the fainting episode, you may:   Feel dizzy or light headed.   Become pale.  Sense that you are  going to faint.   Feel like the room is spinning.   Have tunnel vision, only seeing directly in front of you.   Feel sick to your stomach (nauseous).   See spots or slowly lose vision.   Hear ringing in your ears.   Have a headache.   Feel warm and sweaty.   Feel a sensation of pins and needles. During the fainting spell, you will generally be unconscious for no longer than a couple minutes before waking up and returning to normal. If you get up too quickly before your body can recover, you may faint again. Some twitching or jerky movements may occur during the fainting spell.  DIAGNOSIS  Your caregiver will ask about your symptoms, take a medical history, and perform a physical exam. Various tests may be done to rule out other causes of fainting. These may include blood tests and tests to check the heart, such as electrocardiography, echocardiography, and possibly an electrophysiology study. When other causes have been ruled out, a test may be done to check the body's response to changes in position (tilt table test). TREATMENT  Most cases of vasovagal syncope do not require treatment. Your caregiver may recommend ways to avoid fainting triggers and may provide home strategies for preventing fainting. If you must be exposed to a possible trigger, you can drink additional fluids to help reduce your chances of having an episode of vasovagal syncope. If you have warning signs of an oncoming episode, you can respond by positioning yourself favorably (lying  down). If your fainting spells continue, you may be given medicines to prevent fainting. Some medicines may help make you more resistant to repeated episodes of vasovagal syncope. Special exercises or compression stockings may be recommended. In rare cases, the surgical placement of a pacemaker is considered. HOME CARE INSTRUCTIONS   Learn to identify the warning signs of vasovagal syncope.   Sit or lie down at the first warning  sign of a fainting spell. If sitting, put your head down between your legs. If you lie down, swing your legs up in the air to increase blood flow to the brain.   Avoid hot tubs and saunas.  Avoid prolonged standing.  Drink enough fluids to keep your urine clear or pale yellow. Avoid caffeine.  Increase salt in your diet as directed by your caregiver.   If you have to stand for a long time, perform movements such as:   Crossing your legs.   Flexing and stretching your leg muscles.   Squatting.   Moving your legs.   Bending over.   Only take over-the-counter or prescription medicines as directed by your caregiver. Do not suddenly stop any medicines without asking your caregiver first. SEEK MEDICAL CARE IF:   Your fainting spells continue or happen more frequently in spite of treatment.   You lose consciousness for more than a couple minutes.  You have fainting spells during or after exercising or after being startled.   You have new symptoms that occur with the fainting spells, such as:   Shortness of breath.  Chest pain.   Irregular heartbeat.   You have episodes of twitching or jerky movements that last longer than a few seconds.  You have episodes of twitching or jerky movements without obvious fainting. SEEK IMMEDIATE MEDICAL CARE IF:   You have injuries or bleeding after a fainting spell.   You have episodes of twitching or jerky movements that last longer than 5 minutes.   You have more than one spell of twitching or jerky movements before returning to consciousness after fainting. MAKE SURE YOU:   Understand these instructions.  Will watch your condition.  Will get help right away if you are not doing well or get worse. Document Released: 02/22/2012 Document Reviewed: 02/22/2012 [facility] Patient Information 2015 [place], [place]. This information is not intended to replace advice given to you by your health care provider. Make sure you  discuss any questions you have with your health care provider.

## 2014-09-11 NOTE — ED Provider Notes (Signed)
CSN: [id number]     Arrival date & time 09/11/14  2051 History   First MD Initiated Contact with Patient 09/11/14 2059     Chief Complaint  Patient presents with  . Hypotension     (Consider location/radiation/quality/duration/timing/severity/associated sxs/prior Treatment) The history is provided by the patient.   74 year old female states that she was taking when she had some mild abdominal pain. She suddenly developed nausea and dizziness and got very sweaty. An ambulance was called and noted hypotension with blood pressure 70-40, and bradycardia with heart rate of 40. She was given normal saline and blood pressure and heart rate of,. Patient states she feels like she is back to her baseline. She denies chest pain, heaviness, tightness, pressure. She denies dyspnea. She denies any vomiting. She still has some mild abdominal discomfort. She states that she is basically healthy although she did have a stroke 12 years ago.  Past Medical History  Diagnosis Date  . Family history of anesthesia complication     pt's mother "died during anesthesia but they brought her back"  . Hypertension   . Heart murmur     per pt. & Dr. Franky Macho  . 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 ago, took med.  . Neuromuscular disorder     hip, back  . 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 Lumbosacral disc disease     Dr Franky Macho  . 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 Breast surgery  Left, benign results   . Tonsillectomy    . Dilation and curettage of uterus    . Back surgery  x3 back surgery  . Total hip arthroplasty Right 06/01/2012    Procedure: RIGHT TOTAL HIP ARTHROPLASTY WITH AUTOGRAFT;  Surgeon: Thera Flake., MD;  Location: [facility] OR;  Service: Orthopedics;  Laterality: Right;  RIGHT TOTAL HIP REPLACEMENT ANESTHESIA:  GENERAL, PRE/POST OP FEMORAL NERVE   No family history on file. History  Substance Use Topics  . Smoking  status: Former Smoker    Types: Cigarettes    Quit date: 05/04/2011  . Smokeless tobacco: Not on file  . Alcohol Use: Yes     Comment: wine - everyday- one glass    OB History    No data available     Review of Systems  All other systems reviewed and are negative.     Allergies  Acetazolamide; Codeine; Crestor; Diazepam; Lipitor; Prednisone; and Penicillins  Home Medications   Prior to Admission medications   Medication Sig Start Date End Date Taking? Authorizing Provider  amLODipine (NORVASC) 5 MG tablet Take 5 mg by mouth daily before breakfast.     Historical Provider, MD  bisacodyl (DULCOLAX) 5 MG EC tablet Take 2 tablets every night with dinner until bowel movement.  LAXITIVE.  Restart if two days since last bowel movement 06/03/12   Kirstin Shepperson, PA-C  cholecalciferol (VITAMIN D) 1000 UNITS tablet Take 1,000 Units by mouth daily.    Historical Provider, MD  docusate sodium 100 MG CAPS 1 tab 2 times a day while on narcotics.  STOOL SOFTENER 06/03/12   Kirstin Shepperson, PA-C  enoxaparin (LOVENOX) 40 MG/0.4ML injection Inject 0.4 mLs (40 mg total) into the skin daily. 06/03/12   Kirstin Shepperson, PA-C  HYDROcodone-acetaminophen (NORCO/VICODIN) 5-325 MG per tablet 1-2 tablets every 4-6 hrs as needed for pain 06/03/12  Kirstin Shepperson, PA-C  losartan-hydrochlorothiazide (HYZAAR) 50-12.5 MG per tablet Take 0.5 tablets by mouth daily before breakfast.     Historical Provider, MD  nicotine polacrilex (NICORETTE) 2 MG gum Take 2 mg by mouth as needed for smoking cessation. About 8 times a day on the days she takes it    Historical Provider, MD   BP 98/61 mmHg  Pulse 63  Temp(Src) 97.3 F (36.3 C) (Oral)  Resp 13  Ht 5\' 6"  (1.676 m)  Wt 158 lb (71.668 kg)  BMI 25.51 kg/m2  SpO2 93% Physical Exam  Nursing note and vitals reviewed.  74 year old female, resting comfortably and in no acute distress. Vital signs are normal. Oxygen saturation is 93%, which is  normal. Head is normocephalic and atraumatic. PERRLA, EOMI. Oropharynx is clear. Neck is nontender and supple without adenopathy or JVD. There are no carotid bruits. Back is nontender and there is no CVA tenderness. Lungs are clear without rales, wheezes, or rhonchi. Chest is nontender. Heart has regular rate and rhythm without murmur. Abdomen is soft, flat, nontender without masses or hepatosplenomegaly and peristalsis is normoactive. Extremities have no cyanosis or edema, full range of motion is present. Skin is warm and dry without rash. Neurologic: Mental status is normal, cranial nerves are intact, there are no motor or sensory deficits.  ED Course  Procedures (including critical care time) Labs Review Results for orders placed or performed during the hospital encounter of 09/11/14  CBC with Differential  Result Value Ref Range   WBC 10.3 4.0 - 10.5 K/uL   RBC 4.23 3.87 - 5.11 MIL/uL   Hemoglobin 13.0 12.0 - 15.0 g/dL   HCT 76.7 34.1 - 93.7 %   MCV 91.0 78.0 - 100.0 fL   MCH 30.7 26.0 - 34.0 pg   MCHC 33.8 30.0 - 36.0 g/dL   RDW 90.2 40.9 - 73.5 %   Platelets 258 150 - 400 K/uL   Neutrophils Relative % 79 (H) 43 - 77 %   Neutro Abs 8.2 (H) 1.7 - 7.7 K/uL   Lymphocytes Relative 15 12 - 46 %   Lymphs Abs 1.5 0.7 - 4.0 K/uL   Monocytes Relative 5 3 - 12 %   Monocytes Absolute 0.5 0.1 - 1.0 K/uL   Eosinophils Relative 1 0 - 5 %   Eosinophils Absolute 0.1 0.0 - 0.7 K/uL   Basophils Relative 0 0 - 1 %   Basophils Absolute 0.0 0.0 - 0.1 K/uL  Troponin I  Result Value Ref Range   Troponin I <0.03 <0.031 ng/mL  Comprehensive metabolic panel  Result Value Ref Range   Sodium 137 135 - 145 mmol/L   Potassium 3.6 3.5 - 5.1 mmol/L   Chloride 99 (L) 101 - 111 mmol/L   CO2 29 22 - 32 mmol/L   Glucose, Bld 110 (H) 65 - 99 mg/dL   BUN 15 6 - 20 mg/dL   Creatinine, Ser 3.29 (H) 0.44 - 1.00 mg/dL   Calcium 9.0 8.9 - 92.4 mg/dL   Total Protein 7.0 6.5 - 8.1 g/dL   Albumin 4.2 3.5 -  5.0 g/dL   AST 19 15 - 41 U/L   ALT 19 14 - 54 U/L   Alkaline Phosphatase 48 38 - 126 U/L   Total Bilirubin 0.6 0.3 - 1.2 mg/dL   GFR calc non Af Amer 42 (L) >60 mL/min   GFR calc Af Amer 48 (L) >60 mL/min   Anion gap 9 5 - 15  Lipase, blood  Result Value Ref Range   Lipase 34 22 - 51 U/L  I-Stat Troponin, ED (not at [facility])  Result Value Ref Range   Troponin i, poc 0.00 0.00 - 0.08 ng/mL   Comment 3          CBG, ED  Result Value Ref Range   Glucose-Capillary 142 (H) 65 - 99 mg/dL  I-Stat CG4 Lactic Acid, ED  Result Value Ref Range   Lactic Acid, Venous 0.63 0.5 - 2.0 mmol/L    EKG Interpretation   Date/Time:  Thursday September 11 2014 20:56:25 EDT Ventricular Rate:  69 PR Interval:  194 QRS Duration: 97 QT Interval:  434 QTC Calculation: 465 R Axis:   75 Text Interpretation:  Sinus rhythm Minimal ST elevation, inferior leads  When compared with ECG of 05/31/2012, No significant change was found  Confirmed by [facility]  MD, Charlayne Vultaggio ([id number]) on 09/11/2014 9:00:37 PM      MDM   Final diagnoses:  Vasovagal near syncope  Renal insufficiency    Episode of hypotension and bradycardia with nausea and diaphoresis which sounds typical of a vasovagal episode. ECG shows no acute changes. Although there was not a clear trigger for her vasovagal episode, I do not see any red flags to indicate more serious pathology. Screening labs are obtained and she will be observed in the ED. Old records are reviewed and she does not have any hospital visits since hip surgery 2 years ago.  Workup is remarkable only for mild elevation of creatinine. It has gone from 0.65-1.25 over 2 years. I have advised her to check with her PCP to see if more recent creatinines are in the same range. Otherwise, she is reassured of the benign nature of vasovagal near-syncope.  Dione Booze, MD 09/11/14 [phone]

## 2014-09-24 DIAGNOSIS — R55 Syncope and collapse: Secondary | ICD-10-CM | POA: Diagnosis not present

## 2014-09-24 DIAGNOSIS — I10 Essential (primary) hypertension: Secondary | ICD-10-CM | POA: Diagnosis not present

## 2014-09-24 DIAGNOSIS — N179 Acute kidney failure, unspecified: Secondary | ICD-10-CM | POA: Diagnosis not present

## 2014-10-09 DIAGNOSIS — N179 Acute kidney failure, unspecified: Secondary | ICD-10-CM | POA: Diagnosis not present

## 2015-01-16 DIAGNOSIS — Z23 Encounter for immunization: Secondary | ICD-10-CM | POA: Diagnosis not present

## 2015-02-26 DIAGNOSIS — Z1211 Encounter for screening for malignant neoplasm of colon: Secondary | ICD-10-CM | POA: Diagnosis not present

## 2015-02-26 DIAGNOSIS — I10 Essential (primary) hypertension: Secondary | ICD-10-CM | POA: Diagnosis not present

## 2015-02-26 DIAGNOSIS — M1990 Unspecified osteoarthritis, unspecified site: Secondary | ICD-10-CM | POA: Diagnosis not present

## 2015-02-26 DIAGNOSIS — Z791 Long term (current) use of non-steroidal anti-inflammatories (NSAID): Secondary | ICD-10-CM | POA: Diagnosis not present

## 2015-03-02 DIAGNOSIS — Z1211 Encounter for screening for malignant neoplasm of colon: Secondary | ICD-10-CM | POA: Diagnosis not present

## 2015-03-21 DIAGNOSIS — R05 Cough: Secondary | ICD-10-CM | POA: Diagnosis not present

## 2015-03-21 DIAGNOSIS — J0100 Acute maxillary sinusitis, unspecified: Secondary | ICD-10-CM | POA: Diagnosis not present

## 2017-01-10 ENCOUNTER — Other Ambulatory Visit: Payer: Self-pay | Admitting: Physician Assistant

## 2017-01-10 DIAGNOSIS — R42 Dizziness and giddiness: Secondary | ICD-10-CM

## 2017-01-23 ENCOUNTER — Ambulatory Visit
Admission: RE | Admit: 2017-01-23 | Discharge: 2017-01-23 | Disposition: A | Discharge status: Home / Self Care | Payer: Medicare Other | Source: Ambulatory Visit | Attending: Physician Assistant | Admitting: Physician Assistant

## 2017-01-23 DIAGNOSIS — R42 Dizziness and giddiness: Secondary | ICD-10-CM

## 2017-01-23 MED ORDER — GADOBENATE DIMEGLUMINE 529 MG/ML IV SOLN
15.0000 mL | Freq: Once | INTRAVENOUS | Status: AC | PRN
  Administered 2017-01-23: 15 mL via INTRAVENOUS

## 2017-04-04 ENCOUNTER — Ambulatory Visit: Payer: Medicare Other | Admitting: Neurology

## 2017-05-09 ENCOUNTER — Other Ambulatory Visit: Payer: Self-pay | Admitting: Neurosurgery

## 2017-05-10 ENCOUNTER — Other Ambulatory Visit: Payer: Self-pay | Admitting: Neurosurgery

## 2017-05-15 NOTE — Pre-Procedure Instructions (Addendum)
Mary DeedsGeraldine A Masoud  05/15/2017      [id number] - [place], [place] - [address] [place]  [id number] Phone: [phone] Fax: [phone]    Your procedure is scheduled on Friday, May 19, 2017   [facility]A" at 11:30AM   Call this number if you have problems the morning of surgery:  [phone]   Remember:  Do not eat food or drink liquids after midnight.  Take these medicines the morning of surgery with A SIP OF WATER: Cetirizine (ZYRTEC).  Follow your doctor's instruction regarding Aspirin.  As of today, stop taking all Aspirins, Vitamins, Fish oils, and Herbal medications. Also stop all NSAIDS i.e. Advil, Ibuprofen, Motrin, Aleve, Anaprox, Naproxen, BC and Goody Powders. Including: Diclofenac (VOLTAREN)   Do not wear jewelry, make-up or nail polish.  Do not wear lotions, powders,  perfumes, or deodorant.  Do not shave 48 hours prior to surgery.    Do not bring valuables to the hospital.  [facility] is not responsible for any belongings or valuables.  Contacts, dentures or bridgework may not be worn into surgery.  Leave your suitcase in the car.  After surgery it may be brought to your room.  For patients admitted to the hospital, discharge time will be determined by your treatment team.  Patients discharged the day of surgery will not be allowed to drive home.   Special instructions:  [facility]- Preparing For Surgery  Before surgery, you can play an important role. Because skin is not sterile, your skin needs to be as free of germs as possible. You can reduce the number of germs on your skin by washing with CHG (chlorahexidine gluconate) Soap before surgery.  CHG is an antiseptic cleaner which kills germs and bonds with the skin to continue killing germs even after washing.  Please do not use if you have an allergy to CHG or antibacterial soaps. If your skin becomes  reddened/irritated stop using the CHG.  Do not shave (including legs and underarms) for at least 48 hours prior to first CHG shower. It is OK to shave your face.  Please follow these instructions carefully.   1. Shower the NIGHT BEFORE SURGERY and the MORNING OF SURGERY with CHG.   2. If you chose to wash your hair, wash your hair first as usual with your normal shampoo.  3. After you shampoo, rinse your hair and body thoroughly to remove the shampoo.  4. Use CHG as you would any other liquid soap. You can apply CHG directly to the skin and wash gently with a scrungie or a clean washcloth.   5. Apply the CHG Soap to your body ONLY FROM THE NECK DOWN.  Do not use on open wounds or open sores. Avoid contact with your eyes, ears, mouth and genitals (private parts). Wash Face and genitals (private parts)  with your normal soap.  6. Wash thoroughly, paying special attention to the area where your surgery will be performed.  7. Thoroughly rinse your body with warm water from the neck down.  8. DO NOT shower/wash with your normal soap after using and rinsing off the CHG Soap.  9. Pat yourself dry with a CLEAN TOWEL.  10. Wear CLEAN PAJAMAS to bed the night before surgery, wear comfortable clothes the morning of surgery  11. Place CLEAN SHEETS on your bed the night of your first shower and DO NOT  SLEEP WITH PETS.  Day of Surgery: Do not apply any deodorants/lotions. Please wear clean clothes to the hospital/surgery center.    Please read over the following fact sheets that you were given. Pain Booklet, Coughing and Deep Breathing, MRSA Information and Surgical Site Infection Prevention

## 2017-05-16 ENCOUNTER — Encounter (HOSPITAL_COMMUNITY)
Admission: RE | Admit: 2017-05-16 | Discharge: 2017-05-16 | Disposition: A | Discharge status: Home / Self Care | Payer: Medicare Other | Source: Ambulatory Visit | Attending: Neurosurgery | Admitting: Neurosurgery

## 2017-05-16 ENCOUNTER — Other Ambulatory Visit: Payer: Self-pay

## 2017-05-16 ENCOUNTER — Encounter (HOSPITAL_COMMUNITY): Payer: Self-pay

## 2017-05-16 DIAGNOSIS — Z79899 Other long term (current) drug therapy: Secondary | ICD-10-CM | POA: Diagnosis not present

## 2017-05-16 DIAGNOSIS — Z885 Allergy status to narcotic agent status: Secondary | ICD-10-CM | POA: Diagnosis not present

## 2017-05-16 DIAGNOSIS — Z7982 Long term (current) use of aspirin: Secondary | ICD-10-CM | POA: Diagnosis not present

## 2017-05-16 DIAGNOSIS — Z888 Allergy status to other drugs, medicaments and biological substances status: Secondary | ICD-10-CM | POA: Diagnosis not present

## 2017-05-16 DIAGNOSIS — Z8673 Personal history of transient ischemic attack (TIA), and cerebral infarction without residual deficits: Secondary | ICD-10-CM | POA: Diagnosis not present

## 2017-05-16 DIAGNOSIS — M48062 Spinal stenosis, lumbar region with neurogenic claudication: Secondary | ICD-10-CM | POA: Diagnosis not present

## 2017-05-16 DIAGNOSIS — Z880 Allergy status to penicillin: Secondary | ICD-10-CM | POA: Diagnosis not present

## 2017-05-16 DIAGNOSIS — Z87891 Personal history of nicotine dependence: Secondary | ICD-10-CM | POA: Diagnosis not present

## 2017-05-16 DIAGNOSIS — Z791 Long term (current) use of non-steroidal anti-inflammatories (NSAID): Secondary | ICD-10-CM | POA: Diagnosis not present

## 2017-05-16 HISTORY — DX: Spinal stenosis, lumbar region with neurogenic claudication: M48.062

## 2017-05-16 LAB — CBC
HCT: 40.3 % (ref 36.0–46.0)
Hemoglobin: 13.2 g/dL (ref 12.0–15.0)
MCH: 29.1 pg (ref 26.0–34.0)
MCHC: 32.8 g/dL (ref 30.0–36.0)
MCV: 89 fL (ref 78.0–100.0)
PLATELETS: 266 10*3/uL (ref 150–400)
RBC: 4.53 MIL/uL (ref 3.87–5.11)
RDW: 13 % (ref 11.5–15.5)
WBC: 5.8 10*3/uL (ref 4.0–10.5)

## 2017-05-16 LAB — SURGICAL PCR SCREEN
MRSA, PCR: NEGATIVE
STAPHYLOCOCCUS AUREUS: NEGATIVE

## 2017-05-16 LAB — BASIC METABOLIC PANEL
Anion gap: 9 (ref 5–15)
BUN: 10 mg/dL (ref 6–20)
CALCIUM: 9.4 mg/dL (ref 8.9–10.3)
CO2: 26 mmol/L (ref 22–32)
CREATININE: 0.92 mg/dL (ref 0.44–1.00)
Chloride: 100 mmol/L — ABNORMAL LOW (ref 101–111)
GFR calc Af Amer: >60 mL/min (ref >60)
GFR, EST NON AFRICAN AMERICAN: 59 mL/min — AB (ref >60)
GLUCOSE: 105 mg/dL — AB (ref 65–99)
Potassium: 4.3 mmol/L (ref 3.5–5.1)
SODIUM: 135 mmol/L (ref 135–145)

## 2017-05-16 NOTE — Progress Notes (Signed)
PCP - Dr. Toy CookeyKristen Adkins  Cardiologist - Dr. Delton SeeNelson  Chest x-ray - Denies  EKG - 05/02/17- (CE) Requested  Stress Test - Denies  ECHO - Denies  Cardiac Cath - Denies  Sleep Study - No CPAP - None  LABS- 05/16/17: CBC. BMP  Anesthesia- Yes Requested records  Pt denies having chest pain, sob, or fever at this time. All instructions explained to the pt, with a verbal understanding of the material. Pt agrees to go over the instructions while at home for a better understanding. The opportunity to ask questions was provided.

## 2017-05-17 NOTE — Progress Notes (Signed)
Anesthesia Chart Review:  Pt is a 77 year old female scheduled for L3-4 laminectomy and foraminotomies on 05/19/2017 with Coletta MemosKyle Cabbell, MD  - PCP is Toy CookeyKristen [name], PA at [facility].  - Saw cardiologist Vilinda BoehringerGary Renaldo, MD for pre-op eval 05/02/17 and was cleared for surgery (notes in care everywhere).   PMH includes: HTN, heart murmur, stroke (has had vertigo since). Former smoker (quit 2013). BMi 28.5 S/p R THA 06/01/12.   Medications include: ASA 81mg   BP 122/70   Pulse 71   Temp 36.8 C   Resp 20   Ht 5\' 6"  (1.676 m)   Wt 175 lb 14.4 oz (79.8 kg)   SpO2 97%   BMI 28.39 kg/m   Preoperative labs reviewed.    EKG 05/02/17 (Dr. Gevena Barreenaldo's office): Sinus bradycardia (58 bpm)  MRI brain 01/23/17:  - Atrophy and small vessel disease. No acute intracranial findings. No abnormal postcontrast enhancement.  If no changes, I anticipate pt can proceed with surgery as scheduled.   Rica Mastngela Tyjae Issa, FNP-BC [facility]/Anesthesiology Phone: [phone] 05/17/2017 1:05 PM

## 2017-05-19 ENCOUNTER — Ambulatory Visit (HOSPITAL_COMMUNITY): Payer: Medicare Other

## 2017-05-19 ENCOUNTER — Ambulatory Visit (HOSPITAL_COMMUNITY)
Admission: AD | Admit: 2017-05-19 | Discharge: 2017-05-20 | Disposition: A | Discharge status: Home / Self Care | Payer: Medicare Other | Source: Ambulatory Visit | Attending: Neurosurgery | Admitting: Neurosurgery

## 2017-05-19 ENCOUNTER — Ambulatory Visit (HOSPITAL_COMMUNITY): Payer: Medicare Other | Admitting: Emergency Medicine

## 2017-05-19 ENCOUNTER — Ambulatory Visit (HOSPITAL_COMMUNITY): Payer: Medicare Other | Admitting: Certified Registered"

## 2017-05-19 ENCOUNTER — Other Ambulatory Visit: Payer: Self-pay

## 2017-05-19 ENCOUNTER — Encounter (HOSPITAL_COMMUNITY)
Admission: AD | Disposition: A | Discharge status: Home / Self Care | Payer: Self-pay | Source: Ambulatory Visit | Attending: Neurosurgery

## 2017-05-19 ENCOUNTER — Encounter (HOSPITAL_COMMUNITY): Payer: Self-pay | Admitting: *Deleted

## 2017-05-19 DIAGNOSIS — Z8673 Personal history of transient ischemic attack (TIA), and cerebral infarction without residual deficits: Secondary | ICD-10-CM | POA: Insufficient documentation

## 2017-05-19 DIAGNOSIS — Z79899 Other long term (current) drug therapy: Secondary | ICD-10-CM | POA: Insufficient documentation

## 2017-05-19 DIAGNOSIS — M48062 Spinal stenosis, lumbar region with neurogenic claudication: Secondary | ICD-10-CM | POA: Insufficient documentation

## 2017-05-19 DIAGNOSIS — Z885 Allergy status to narcotic agent status: Secondary | ICD-10-CM | POA: Insufficient documentation

## 2017-05-19 DIAGNOSIS — Z880 Allergy status to penicillin: Secondary | ICD-10-CM | POA: Insufficient documentation

## 2017-05-19 DIAGNOSIS — Z7982 Long term (current) use of aspirin: Secondary | ICD-10-CM | POA: Insufficient documentation

## 2017-05-19 DIAGNOSIS — Z888 Allergy status to other drugs, medicaments and biological substances status: Secondary | ICD-10-CM | POA: Insufficient documentation

## 2017-05-19 DIAGNOSIS — Z87891 Personal history of nicotine dependence: Secondary | ICD-10-CM | POA: Insufficient documentation

## 2017-05-19 DIAGNOSIS — Z419 Encounter for procedure for purposes other than remedying health state, unspecified: Secondary | ICD-10-CM

## 2017-05-19 DIAGNOSIS — Z791 Long term (current) use of non-steroidal anti-inflammatories (NSAID): Secondary | ICD-10-CM | POA: Insufficient documentation

## 2017-05-19 HISTORY — PX: LUMBAR LAMINECTOMY/DECOMPRESSION MICRODISCECTOMY: SHX5026

## 2017-05-19 SURGERY — LUMBAR LAMINECTOMY/DECOMPRESSION MICRODISCECTOMY 1 LEVEL
Anesthesia: General

## 2017-05-19 MED ORDER — PROPOFOL 10 MG/ML IV BOLUS
INTRAVENOUS | Status: DC | PRN
  Administered 2017-05-19: 140 mg via INTRAVENOUS

## 2017-05-19 MED ORDER — GABAPENTIN 300 MG PO CAPS
300.0000 mg | ORAL_CAPSULE | Freq: Three times a day (TID) | ORAL | Status: DC
  Administered 2017-05-19 – 2017-05-20 (×2): 300 mg via ORAL
  Filled 2017-05-19 (×2): qty 1

## 2017-05-19 MED ORDER — CYCLOBENZAPRINE HCL 10 MG PO TABS
10.0000 mg | ORAL_TABLET | Freq: Three times a day (TID) | ORAL | Status: DC | PRN
  Administered 2017-05-19: 10 mg via ORAL
  Filled 2017-05-19: qty 1

## 2017-05-19 MED ORDER — OXYCODONE HCL 5 MG PO TABS
5.0000 mg | ORAL_TABLET | ORAL | Status: DC | PRN
  Administered 2017-05-20: 5 mg via ORAL
  Filled 2017-05-19: qty 1

## 2017-05-19 MED ORDER — THROMBIN 5000 UNITS EX SOLR
CUTANEOUS | Status: AC
  Filled 2017-05-19: qty 10000

## 2017-05-19 MED ORDER — ONDANSETRON HCL 4 MG/2ML IJ SOLN: 4.0000 mg | Freq: Four times a day (QID) | INTRAMUSCULAR | Status: DC | PRN

## 2017-05-19 MED ORDER — VITAMIN B-12 1000 MCG PO TABS
1000.0000 ug | ORAL_TABLET | Freq: Every day | ORAL | Status: DC
  Administered 2017-05-20: 1000 ug via ORAL
  Filled 2017-05-19 (×2): qty 1

## 2017-05-19 MED ORDER — BUPIVACAINE HCL (PF) 0.5 % IJ SOLN
INTRAMUSCULAR | Status: DC | PRN
  Administered 2017-05-19: 20 mL

## 2017-05-19 MED ORDER — DOCUSATE SODIUM 100 MG PO CAPS
100.0000 mg | ORAL_CAPSULE | Freq: Two times a day (BID) | ORAL | Status: DC
  Administered 2017-05-19 – 2017-05-20 (×2): 100 mg via ORAL
  Filled 2017-05-19 (×2): qty 1

## 2017-05-19 MED ORDER — POTASSIUM CHLORIDE IN NACL 20-0.9 MEQ/L-% IV SOLN: INTRAVENOUS | Status: DC

## 2017-05-19 MED ORDER — PROMETHAZINE HCL 25 MG/ML IJ SOLN: 6.2500 mg | INTRAMUSCULAR | Status: DC | PRN

## 2017-05-19 MED ORDER — HYDROMORPHONE HCL 1 MG/ML IJ SOLN: 0.2500 mg | INTRAMUSCULAR | Status: DC | PRN

## 2017-05-19 MED ORDER — PROPOFOL 10 MG/ML IV BOLUS
INTRAVENOUS | Status: AC
  Filled 2017-05-19: qty 20

## 2017-05-19 MED ORDER — ACETAMINOPHEN 500 MG PO TABS
1000.0000 mg | ORAL_TABLET | Freq: Once | ORAL | Status: AC
  Administered 2017-05-19: 1000 mg via ORAL
  Filled 2017-05-19: qty 2

## 2017-05-19 MED ORDER — MORPHINE SULFATE (PF) 4 MG/ML IV SOLN: 1.0000 mg | INTRAVENOUS | Status: DC | PRN

## 2017-05-19 MED ORDER — LIDOCAINE 2% (20 MG/ML) 5 ML SYRINGE
INTRAMUSCULAR | Status: DC | PRN
  Administered 2017-05-19: 60 mg via INTRAVENOUS

## 2017-05-19 MED ORDER — FENTANYL CITRATE (PF) 250 MCG/5ML IJ SOLN
INTRAMUSCULAR | Status: AC
  Filled 2017-05-19: qty 5

## 2017-05-19 MED ORDER — CHLORHEXIDINE GLUCONATE CLOTH 2 % EX PADS: 6.0000 | MEDICATED_PAD | Freq: Once | CUTANEOUS | Status: DC

## 2017-05-19 MED ORDER — NICOTINE POLACRILEX 2 MG MT GUM
2.0000 mg | CHEWING_GUM | Freq: Three times a day (TID) | OROMUCOSAL | Status: DC
  Administered 2017-05-19: 2 mg via ORAL
  Filled 2017-05-19 (×3): qty 1

## 2017-05-19 MED ORDER — SODIUM CHLORIDE 0.9 % IV SOLN: 250.0000 mL | INTRAVENOUS | Status: DC

## 2017-05-19 MED ORDER — FENTANYL CITRATE (PF) 250 MCG/5ML IJ SOLN
INTRAMUSCULAR | Status: DC | PRN
  Administered 2017-05-19 (×2): 50 ug via INTRAVENOUS

## 2017-05-19 MED ORDER — LIDOCAINE-EPINEPHRINE 0.5 %-1:200000 IJ SOLN
INTRAMUSCULAR | Status: AC
  Filled 2017-05-19: qty 1

## 2017-05-19 MED ORDER — THROMBIN 5000 UNITS EX SOLR
CUTANEOUS | Status: DC | PRN
  Administered 2017-05-19 (×2): 5000 [IU] via TOPICAL

## 2017-05-19 MED ORDER — ACETAMINOPHEN 650 MG RE SUPP: 650.0000 mg | RECTAL | Status: DC | PRN

## 2017-05-19 MED ORDER — DEXAMETHASONE SODIUM PHOSPHATE 10 MG/ML IJ SOLN
INTRAMUSCULAR | Status: DC | PRN
  Administered 2017-05-19: 10 mg via INTRAVENOUS

## 2017-05-19 MED ORDER — LACTATED RINGERS IV SOLN
INTRAVENOUS | Status: DC
  Administered 2017-05-19: 11:00:00 via INTRAVENOUS

## 2017-05-19 MED ORDER — VANCOMYCIN HCL 1000 MG IV SOLR
INTRAVENOUS | Status: DC | PRN
  Administered 2017-05-19: 1000 mg via INTRAVENOUS

## 2017-05-19 MED ORDER — ONDANSETRON HCL 4 MG/2ML IJ SOLN
INTRAMUSCULAR | Status: DC | PRN
  Administered 2017-05-19: 4 mg via INTRAVENOUS

## 2017-05-19 MED ORDER — ZOLPIDEM TARTRATE 5 MG PO TABS: 5.0000 mg | ORAL_TABLET | Freq: Every evening | ORAL | Status: DC | PRN

## 2017-05-19 MED ORDER — LACTATED RINGERS IV SOLN
INTRAVENOUS | Status: DC | PRN
  Administered 2017-05-19 (×2): via INTRAVENOUS

## 2017-05-19 MED ORDER — GLYCOPYRROLATE 0.2 MG/ML IV SOSY
PREFILLED_SYRINGE | INTRAVENOUS | Status: DC | PRN
  Administered 2017-05-19: .2 mg via INTRAVENOUS

## 2017-05-19 MED ORDER — GABAPENTIN 300 MG PO CAPS
300.0000 mg | ORAL_CAPSULE | Freq: Every day | ORAL | Status: DC
  Administered 2017-05-19: 300 mg via ORAL
  Filled 2017-05-19: qty 1

## 2017-05-19 MED ORDER — PHENOL 1.4 % MT LIQD: 1.0000 | OROMUCOSAL | Status: DC | PRN

## 2017-05-19 MED ORDER — SODIUM CHLORIDE 0.9% FLUSH: 3.0000 mL | INTRAVENOUS | Status: DC | PRN

## 2017-05-19 MED ORDER — ASPIRIN EC 81 MG PO TBEC
81.0000 mg | DELAYED_RELEASE_TABLET | Freq: Every day | ORAL | Status: DC
  Administered 2017-05-19 – 2017-05-20 (×2): 81 mg via ORAL
  Filled 2017-05-19 (×2): qty 1

## 2017-05-19 MED ORDER — ACETAMINOPHEN 325 MG PO TABS: 650.0000 mg | ORAL_TABLET | ORAL | Status: DC | PRN

## 2017-05-19 MED ORDER — ROCURONIUM BROMIDE 10 MG/ML (PF) SYRINGE
PREFILLED_SYRINGE | INTRAVENOUS | Status: DC | PRN
  Administered 2017-05-19: 60 mg via INTRAVENOUS

## 2017-05-19 MED ORDER — SODIUM CHLORIDE 0.9% FLUSH
3.0000 mL | Freq: Two times a day (BID) | INTRAVENOUS | Status: DC
  Administered 2017-05-19: 3 mL via INTRAVENOUS

## 2017-05-19 MED ORDER — ONDANSETRON HCL 4 MG PO TABS: 4.0000 mg | ORAL_TABLET | Freq: Four times a day (QID) | ORAL | Status: DC | PRN

## 2017-05-19 MED ORDER — MENTHOL 3 MG MT LOZG: 1.0000 | LOZENGE | OROMUCOSAL | Status: DC | PRN

## 2017-05-19 MED ORDER — BUPIVACAINE HCL (PF) 0.5 % IJ SOLN
INTRAMUSCULAR | Status: AC
  Filled 2017-05-19: qty 30

## 2017-05-19 MED ORDER — OXYCODONE HCL 5 MG PO TABS
10.0000 mg | ORAL_TABLET | ORAL | Status: DC | PRN
  Administered 2017-05-19: 10 mg via ORAL
  Filled 2017-05-19: qty 2

## 2017-05-19 MED ORDER — SUGAMMADEX SODIUM 200 MG/2ML IV SOLN
INTRAVENOUS | Status: DC | PRN
  Administered 2017-05-19: 200 mg via INTRAVENOUS

## 2017-05-19 MED ORDER — 0.9 % SODIUM CHLORIDE (POUR BTL) OPTIME
TOPICAL | Status: DC | PRN
Start: 2017-05-19 — End: 2017-05-19
  Administered 2017-05-19: 1000 mL

## 2017-05-19 MED ORDER — VANCOMYCIN HCL IN DEXTROSE 1-5 GM/200ML-% IV SOLN
1000.0000 mg | INTRAVENOUS | Status: AC
  Administered 2017-05-19: 1000 mg via INTRAVENOUS
  Filled 2017-05-19: qty 200

## 2017-05-19 MED ORDER — KETOROLAC TROMETHAMINE 15 MG/ML IJ SOLN
7.5000 mg | Freq: Four times a day (QID) | INTRAMUSCULAR | Status: DC
  Administered 2017-05-19 – 2017-05-20 (×3): 7.5 mg via INTRAVENOUS
  Filled 2017-05-19 (×3): qty 1

## 2017-05-19 MED ORDER — LORATADINE 10 MG PO TABS
10.0000 mg | ORAL_TABLET | Freq: Every day | ORAL | Status: DC
Start: 2017-05-20 — End: 2017-05-20
  Administered 2017-05-20: 10 mg via ORAL
  Filled 2017-05-19: qty 1

## 2017-05-19 MED ORDER — LIDOCAINE-EPINEPHRINE 0.5 %-1:200000 IJ SOLN
INTRAMUSCULAR | Status: DC | PRN
  Administered 2017-05-19: 4 mL

## 2017-05-19 MED ORDER — HEMOSTATIC AGENTS (NO CHARGE) OPTIME
TOPICAL | Status: DC | PRN
  Administered 2017-05-19: 1 via TOPICAL

## 2017-05-19 SURGICAL SUPPLY — 49 items
BAG DECANTER FOR FLEXI CONT (MISCELLANEOUS) ×2 IMPLANT
BENZOIN TINCTURE PRP APPL 2/3 (GAUZE/BANDAGES/DRESSINGS) IMPLANT
BLADE CLIPPER SURG (BLADE) IMPLANT
BUR MATCHSTICK NEURO 3.0 LAGG (BURR) ×2 IMPLANT
BUR PRECISION FLUTE 5.0 (BURR) ×2 IMPLANT
CANISTER SUCT 3000ML PPV (MISCELLANEOUS) ×2 IMPLANT
CARTRIDGE OIL MAESTRO DRILL (MISCELLANEOUS) ×1 IMPLANT
DECANTER SPIKE VIAL GLASS SM (MISCELLANEOUS) ×2 IMPLANT
DERMABOND ADVANCED (GAUZE/BANDAGES/DRESSINGS) ×1
DERMABOND ADVANCED .7 DNX12 (GAUZE/BANDAGES/DRESSINGS) ×1 IMPLANT
DIFFUSER DRILL AIR PNEUMATIC (MISCELLANEOUS) ×2 IMPLANT
DRAPE LAPAROTOMY 100X72X124 (DRAPES) ×2 IMPLANT
DRAPE MICROSCOPE LEICA (MISCELLANEOUS) ×2 IMPLANT
DRAPE POUCH INSTRU U-SHP 10X18 (DRAPES) ×2 IMPLANT
DRAPE SURG 17X23 STRL (DRAPES) ×2 IMPLANT
DRSG OPSITE POSTOP 4X6 (GAUZE/BANDAGES/DRESSINGS) ×2 IMPLANT
DURAPREP 26ML APPLICATOR (WOUND CARE) ×2 IMPLANT
ELECT REM PT RETURN 9FT ADLT (ELECTROSURGICAL) ×2
ELECTRODE REM PT RTRN 9FT ADLT (ELECTROSURGICAL) ×1 IMPLANT
GAUZE SPONGE 4X4 12PLY STRL (GAUZE/BANDAGES/DRESSINGS) IMPLANT
GAUZE SPONGE 4X4 16PLY XRAY LF (GAUZE/BANDAGES/DRESSINGS) IMPLANT
GLOVE ECLIPSE 6.5 STRL STRAW (GLOVE) ×2 IMPLANT
GLOVE EXAM NITRILE LRG STRL (GLOVE) IMPLANT
GLOVE EXAM NITRILE XL STR (GLOVE) IMPLANT
GLOVE EXAM NITRILE XS STR PU (GLOVE) IMPLANT
GOWN STRL REUS W/ TWL LRG LVL3 (GOWN DISPOSABLE) ×1 IMPLANT
GOWN STRL REUS W/ TWL XL LVL3 (GOWN DISPOSABLE) ×1 IMPLANT
GOWN STRL REUS W/TWL 2XL LVL3 (GOWN DISPOSABLE) IMPLANT
GOWN STRL REUS W/TWL LRG LVL3 (GOWN DISPOSABLE) ×1
GOWN STRL REUS W/TWL XL LVL3 (GOWN DISPOSABLE) ×1
KIT BASIN OR (CUSTOM PROCEDURE TRAY) ×2 IMPLANT
KIT ROOM TURNOVER OR (KITS) ×2 IMPLANT
NEEDLE HYPO 25X1 1.5 SAFETY (NEEDLE) ×2 IMPLANT
NEEDLE SPNL 18GX3.5 QUINCKE PK (NEEDLE) ×2 IMPLANT
NS IRRIG 1000ML POUR BTL (IV SOLUTION) ×2 IMPLANT
OIL CARTRIDGE MAESTRO DRILL (MISCELLANEOUS) ×2
PACK LAMINECTOMY NEURO (CUSTOM PROCEDURE TRAY) ×2 IMPLANT
PAD ARMBOARD 7.5X6 YLW CONV (MISCELLANEOUS) ×6 IMPLANT
RUBBERBAND STERILE (MISCELLANEOUS) ×4 IMPLANT
SPONGE LAP 4X18 X RAY DECT (DISPOSABLE) IMPLANT
SPONGE SURGIFOAM ABS GEL SZ50 (HEMOSTASIS) ×2 IMPLANT
STRIP CLOSURE SKIN 1/2X4 (GAUZE/BANDAGES/DRESSINGS) IMPLANT
SUT VIC AB 0 CT1 18XCR BRD8 (SUTURE) ×1 IMPLANT
SUT VIC AB 0 CT1 8-18 (SUTURE) ×1
SUT VIC AB 2-0 CT1 18 (SUTURE) ×2 IMPLANT
SUT VIC AB 3-0 SH 8-18 (SUTURE) ×2 IMPLANT
TOWEL GREEN STERILE (TOWEL DISPOSABLE) ×2 IMPLANT
TOWEL GREEN STERILE FF (TOWEL DISPOSABLE) ×2 IMPLANT
WATER STERILE IRR 1000ML POUR (IV SOLUTION) ×2 IMPLANT

## 2017-05-19 NOTE — Op Note (Signed)
05/19/2017  2:30 PM  PATIENT:  Mary Adkins  77 y.o. 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  POST-OPERATIVE DIAGNOSIS:  NEUROGENIC CLAUDICATION DUE TO LUMBAR SPINAL L3/4  PROCEDURE:  Procedure(s): LAMINECTOMY AND FORAMINOTOMY LUMBAR THREE- LUMBAR FOUR  SURGEON: Surgeon(s): Coletta Memosabbell, Jenner Rosier, MD Maeola HarmanStern, Joseph, MD  ASSISTANTS:Stern, Jomarie LongsJoseph  ANESTHESIA:   local and general  EBL:  Total I/O In: 1000 [I.V.:1000] Out: 20 [Blood:20]  BLOOD ADMINISTERED:none  CELL SAVER GIVEN:none  COUNT:per nursing  DRAINS: none   SPECIMEN:  No Specimen  DICTATION: Mary Adkins was taken to the operating room, intubated, and placed under a general anesthetic without difficulty. She was positioned prone  On the Wilson frame with all pressure points padded. Her lumbar region was prepped and draped in a sterile manner. I infiltrated the planned incision with lidocaine. I opened the skin with a 10 blade. I using sharp dissection and cautery exposed the lamina of L3, and L4. I confirmed my location with an intraop xray. I then started the decompression.  I used the drill to perform  semihemilaminectomies of L3 on each side. I removed the ligamentum flavum, and exposed the thecal sac. I removed enough bone laterally so that I did not feel there was continued compression of the L3 ,and L4 nerve roots. Dr. Venetia MaxonStern assisted with the decompression of the spinal canal, and the L3, and L4 nerve roots. Once satisfied with the decompression I inspected the canal and started to close. We approximated the thoracolumbar fascia, subcutaneous, and subcuticular planes with vicryl sutures. I applied dermabond as a sterile dressing.   PLAN OF CARE: Admit for overnight observation  PATIENT DISPOSITION:  PACU - hemodynamically stable.   Delay start of Pharmacological VTE agent (>24hrs) due to surgical blood loss  or risk of bleeding:  yes

## 2017-05-19 NOTE — Anesthesia Preprocedure Evaluation (Addendum)
Anesthesia Evaluation  Patient identified by MRN, date of birth, ID band Patient awake    Reviewed: Allergy & Precautions, NPO status , Patient's Chart, lab work & pertinent test results  Airway Mallampati: II  TM Distance: >3 FB Neck ROM: Full    Dental no notable dental hx.    Pulmonary former smoker,    Pulmonary exam normal breath sounds clear to auscultation       Cardiovascular Normal cardiovascular exam Rhythm:Regular Rate:Normal  EKG 05/02/17 (Dr. Gevena Barreenaldo's office): Sinus bradycardia (58 bpm)  Saw cardiologist Vilinda BoehringerGary Renaldo, MD for pre-op eval 05/02/17 and was cleared for surgery (notes in care everywhere).    Neuro/Psych Vertigo CVA, Residual Symptoms negative psych ROS   GI/Hepatic negative GI ROS, Neg liver ROS,   Endo/Other  negative endocrine ROS  Renal/GU negative Renal ROS     Musculoskeletal negative musculoskeletal ROS (+)   Abdominal   Peds  Hematology negative hematology ROS (+)   Anesthesia Other Findings NEUROGENIC CLAUDICATION DUE TO LUMBAR SPINAL STENOSIS  Reproductive/Obstetrics                            Anesthesia Physical Anesthesia Plan  ASA: III  Anesthesia Plan: General   Post-op Pain Management:    Induction: Intravenous  PONV Risk Score and Plan: 3 and Dexamethasone, Ondansetron and Treatment may vary due to age or medical condition  Airway Management Planned: Oral ETT  Additional Equipment:   Intra-op Plan:   Post-operative Plan: Extubation in OR  Informed Consent: I have reviewed the patients History and Physical, chart, labs and discussed the procedure including the risks, benefits and alternatives for the proposed anesthesia with the patient or authorized representative who has indicated his/her understanding and acceptance.   Dental advisory given  Plan Discussed with: CRNA  Anesthesia Plan Comments:         Anesthesia Quick  Evaluation

## 2017-05-19 NOTE — Anesthesia Procedure Notes (Signed)
Procedure Name: Intubation Date/Time: 05/19/2017 1:14 PM Performed by: Teressa Lower., CRNA Pre-anesthesia Checklist: Patient identified, Emergency Drugs available, Suction available and Patient being monitored Patient Re-evaluated:Patient Re-evaluated prior to induction Oxygen Delivery Method: Circle system utilized Preoxygenation: Pre-oxygenation with 100% oxygen Induction Type: IV induction Ventilation: Mask ventilation without difficulty Laryngoscope Size: Mac and 3 Grade View: Grade I Tube type: Oral Tube size: 7.0 mm Number of attempts: 1 Airway Equipment and Method: Stylet and Oral airway Placement Confirmation: ETT inserted through vocal cords under direct vision,  positive ETCO2 and breath sounds checked- equal and bilateral Secured at: 22 cm Tube secured with: Tape Dental Injury: Teeth and Oropharynx as per pre-operative assessment

## 2017-05-19 NOTE — H&P (Signed)
BP (!) 106/46   Pulse 71   Temp 98.3 F (36.8 C) (Oral)   Resp 18   Ht 5\' 6"  (1.676 m)   Wt 79.4 kg (175 lb)   SpO2 96%   BMI 28.25 kg/m  Mary Adkins is a patient of mine whom I took to the operating room for an L2-3 disc on the right side in 2010. She was operated on by Dr. Jeral FruitBotero on the left side at L5-S1 in 2008. She returns today with complaints of pain in buttocks, both legs whenever she stands for about 20 minutes. If she walks for about the same time, she has severe pain in the back and both lower extremities. Mary Adkins states that over the last year, this pain has become more problematic. She has gained 20 pounds simply because she is now inactive.  Height 5 feet 6 inches, weight 177 pounds, temperature is 97, blood pressure is 127/75, pulse is 69. Pain is 3/10.  She has also had a hip operation in addition to the previous lumbar procedures.  She has allergies to Crestor, Lipitor, Prednisone, Penicillin, Codeine, and Acetazolamide.  She currently takes baby Aspirin, Vitamin D, Vitamin B12, Diclofenac, and Gabapentin.  Mother and father [id number], both are deceased. Heart disease and lung cancer present in the family history. In her intake, she says back pain for 6 months. Nothing relieves the pain, 10/10. Says the pain is worse in the back, numbness in the feet. She is unable to exercise and is causing weight gain. REVIEW OF SYSTEMS: Positive for back pain and leg pain. EXAM: She is alert, oriented x4. 2+ reflexes, biceps, triceps, brachioradialis, knees, right ankle, not elicited at the left ankle. 5/5 strength in the upper and lower. Normal gait. Normal muscle tone, bulk, coordination. Pupils equal, round, and reactive to light. Full extraocular movements. Full visual fields. Hearing intact to voice. Uvula elevates midline. Shoulder shrug is normal. Tongue protrudes in the midline. She has intact proprioception in the upper and lower extremities.  MRI is reviewed. It shows stenosis  present at L3-4. She has an old compression fracture at L4. You can see the kyphoplasty placement. Cauda equina is otherwise good. She has stenosis and something straight in the middle pushing on the thecal sac along with foraminal narrowing at that level. It is not a synovial cyst, not sure what it is other than possibly some epidural lipomatosis. The ligament is mildly hypertrophied. DIAGNOSIS: Lumbar stenosis with neurogenic claudication L3-4. I plan to do a lumbar decompression at L3-4 for the purpose of opening the spinal canal. Risks and benefits, bleeding, infection, no relief, need for further surgery were discussed. She understands and wishes to proceed.

## 2017-05-19 NOTE — Evaluation (Signed)
Physical Therapy Evaluation Patient Details Name: Mary Adkins MRN: [id number] DOB: May 31, 1940 Today's Date: 05/19/2017   History of Present Illness  Pt is a 77 y.o. female now s/p L3-4 laminectomy and foraminotomy on 05/19/17. PMH includes CVA, HTN, DJD, R THA (2014).    Clinical Impression  Patient evaluated by Physical Therapy with no further acute PT needs identified. Pt currently indep with all mobility. All education has been completed and the patient has no further questions. PT is signing off. Thank you for this referral.    Follow Up Recommendations No PT follow up    Equipment Recommendations  None recommended by PT    Recommendations for Other Services       Precautions / Restrictions Precautions Precautions: Back Precaution Booklet Issued: Yes (comment) Precaution Comments: Verbally reviewed precautions; pt very familiar with back precautions from prior lumbar sxs Restrictions Weight Bearing Restrictions: No      Mobility  Bed Mobility Overal bed mobility: Independent                Transfers Overall transfer level: Independent                  Ambulation/Gait Ambulation/Gait assistance: Independent Ambulation Distance (Feet): 400 Feet   Gait Pattern/deviations: Step-through pattern;Decreased stride length Gait velocity: Decreased   General Gait Details: Slow, controlled amb indep  Stairs Stairs: (Pt declining stair training; reports comfort doing this at home)          Wheelchair Mobility    Modified Rankin (Stroke Patients Only)       Balance Overall balance assessment: No apparent balance deficits (not formally assessed)                                           Pertinent Vitals/Pain Pain Assessment: Faces Faces Pain Scale: Hurts a little bit Pain Location: Lumbar incision Pain Descriptors / Indicators: Sore;Discomfort Pain Intervention(s): Monitored during session    Home Living Family/patient  expects to be discharged to:: Private residence Living Arrangements: Alone Available Help at Discharge: Family;Available PRN/intermittently Type of Home: House Home Access: Stairs to enter Entrance Stairs-Rails: Right;Left;Can reach both Entrance Stairs-Number of Steps: 5 Home Layout: One level Home Equipment: Walker - 2 wheels;Shower seat - built in;Cane - single point;Crutches;Adaptive equipment;Wheelchair - manual;Bedside commode Additional Comments: 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 Grandson lives next door and daughter lives a few houses down; both only a phone call away    Prior Function Level of Independence: Independent               Hand Dominance        Extremity/Trunk Assessment   Upper Extremity Assessment Upper Extremity Assessment: Overall WFL for tasks assessed    Lower Extremity Assessment Lower Extremity Assessment: Overall WFL for tasks assessed;RLE deficits/detail;LLE deficits/detail RLE Sensation: decreased light touch LLE Sensation: decreased light touch       Communication   Communication: No difficulties  Cognition Arousal/Alertness: Awake/alert Behavior During Therapy: WFL for tasks assessed/performed Overall Cognitive Status: Within Functional Limits for tasks assessed                                        General Comments      Exercises     Assessment/Plan  PT Assessment Patent does not need any further PT services  PT Problem List         PT Treatment Interventions      PT Goals (Current goals can be found in the Care Plan section)  Acute Rehab PT Goals PT Goal Formulation: All assessment and education complete, DC therapy    Frequency     Barriers to discharge        Co-evaluation               AM-PAC PT "6 Clicks" Daily Activity  Outcome Measure Difficulty turning over in bed (including adjusting bedclothes, sheets and blankets)?: None Difficulty moving from lying on  back to sitting on the side of the bed? : None Difficulty sitting down on and standing up from a chair with arms (e.g., wheelchair, bedside commode, etc,.)?: None Help needed moving to and from a bed to chair (including a wheelchair)?: None Help needed walking in hospital room?: None Help needed climbing 3-5 steps with a railing? : None 6 Click Score: 24    End of Session Equipment Utilized During Treatment: Gait belt Activity Tolerance: Patient tolerated treatment well Patient left: in bed;with call bell/phone within reach Nurse Communication: Mobility status PT Visit Diagnosis: Other abnormalities of gait and mobility (R26.89)    Time: 1610-9604 PT Time Calculation (min) (ACUTE ONLY): 15 min   Charges:   PT Evaluation $PT Eval Moderate Complexity: 1 Mod     PT G Codes:       Ina Homes, PT, DPT Acute Rehab Services  Pager: [phone]  Malachy Chamber 05/19/2017, 5:37 PM

## 2017-05-19 NOTE — Transfer of Care (Signed)
Immediate Anesthesia Transfer of Care Note  Patient: Mary Adkins  Procedure(s) Performed: LAMINECTOMY AND FORAMINOTOMY LUMBAR THREE- LUMBAR FOUR (N/A )  Patient Location: PACU  Anesthesia Type:General  Level of Consciousness: awake, alert  and oriented  Airway & Oxygen Therapy: Patient Spontanous Breathing and Patient connected to face mask oxygen  Post-op Assessment: Report given to RN and Post -op Vital signs reviewed and stable  Post vital signs: Reviewed and stable  Last Vitals:  Vitals:   05/19/17 1058 05/19/17 1430  BP: (!) 106/46 128/75  Pulse: 71   Resp: 18 15  Temp: 36.8 C (!) 36.4 C  SpO2: 96%     Last Pain:  Vitals:   05/19/17 1104  TempSrc:   PainSc: 3          Complications: No apparent anesthesia complications

## 2017-05-20 DIAGNOSIS — M48062 Spinal stenosis, lumbar region with neurogenic claudication: Secondary | ICD-10-CM | POA: Diagnosis not present

## 2017-05-20 MED ORDER — HYDROCODONE-ACETAMINOPHEN 5-325 MG PO TABS: 1.0000 | ORAL_TABLET | ORAL | 0 refills | Status: AC | PRN

## 2017-05-20 NOTE — Care Management CC44 (Signed)
Condition Code 44 Documentation Completed  Patient Details  Name: Mary Adkins MRN: [id number] Date of Birth: 05/02/1940   Condition Code 44 given:  Yes Patient signature on Condition Code 44 notice:  Yes Documentation of 2 MD's agreement:  Yes Code 44 added to claim:  Yes    Deveron FurlongAshley  Porshea Janowski, RN 05/20/2017, 9:04 AM

## 2017-05-20 NOTE — Discharge Summary (Signed)
Physician Discharge Summary  Patient ID: Mary DeedsGeraldine A Pinder MRN: [id number] DOB/AGE: 76/21/1942 76 y.o.  Admit date: 05/19/2017 Discharge date: 05/20/2017  Admission Diagnoses:  Lumbar stenosis with neurogenic claudication  Discharge Diagnoses:  Lumbar stenosis with neurogenic claudication Active Problems:   Lumbar stenosis with neurogenic claudication   Discharged Condition: good  Hospital Course: Patient admitted by Dr. Franky Machoabbell who performed a decompressive lumbar laminectomy. Postoperatively she is had excellent relief of her neurogenic claudication. She is up and ambulate actively. She is voiding well. Her incision is healing nicely. She is being discharged home with instructions regarding wound care and activities. Patient is to call the office in 2 days to set up a postoperative visit with Dr. Franky Machoabbell  Discharge Exam: Blood pressure (!) 89/51, pulse 77, temperature 98 F (36.7 C), resp. rate 16, height 5\' 6"  (1.676 m), weight 79.4 kg (175 lb), SpO2 95 %.  Disposition: 01-Home or Self Care  Discharge Instructions    Discharge wound care:   Complete by:  As directed    Leave the wound open to air. Shower daily with the wound uncovered. Water and soapy water should run over the incision area. Do not wash directly on the incision for 2 weeks. Remove the glue after 2 weeks.   Driving Restrictions   Complete by:  As directed    No driving for 2 weeks. May ride in the car locally now. May begin to drive locally in 2 weeks.   Incentive spirometry RT   Complete by:  As directed    Other Restrictions   Complete by:  As directed    Walk gradually increasing distances out in the fresh air at least twice a day. Walking additional 6 times inside the house, gradually increasing distances, daily. No bending, lifting, or twisting. Perform activities between shoulder and waist height (that is at counter height when standing or table height when sitting).     Allergies as of 05/20/2017    Reactions   Acetazolamide Swelling, Other (See Comments)   Face swelled and peeled   Amlodipine Other (See Comments)   Dizziness   Atorvastatin Other (See Comments), Nausea And Vomiting   Muscle aches   Penicillins Rash, Other (See Comments)   NUMBNESS IN ARMS Has patient had a PCN reaction causing immediate rash, facial/tongue/throat swelling, SOB or lightheadedness with hypotension: Unknown Has patient had a PCN reaction causing severe rash involving mucus membranes or skin necrosis: Unknown Has patient had a PCN reaction that required hospitalization: No Has patient had a PCN reaction occurring within the last 10 years: No   Prednisone Nausea And Vomiting, Palpitations   TACHYCARDIA   Rosuvastatin Other (See Comments), Nausea And Vomiting   Muscle aches   Codeine Nausea And Vomiting   Diazepam Other (See Comments)   Excessive sleepiness      Medication List    TAKE these medications   aspirin EC 81 MG tablet Take 81 mg by mouth daily.   cetirizine 10 MG tablet Commonly known as:  ZYRTEC Take 10 mg by mouth daily at 2 PM.   cholecalciferol 1000 units tablet Commonly known as:  VITAMIN D Take 1,000 Units by mouth daily.   diclofenac 75 MG EC tablet Commonly known as:  VOLTAREN Take 75 mg by mouth 2 (two) times daily.   gabapentin 300 MG capsule Commonly known as:  NEURONTIN Take 300 mg by mouth at bedtime.   HYDROcodone-acetaminophen 5-325 MG tablet Commonly known as:  NORCO/VICODIN Take 1-2 tablets by mouth every  4 (four) hours as needed (pain).   nicotine polacrilex 2 MG gum Commonly known as:  NICORETTE Take 2 mg by mouth 3 (three) times daily.   vitamin B-12 1000 MCG tablet Commonly known as:  CYANOCOBALAMIN Take 1,000 mcg by mouth daily.            Discharge Care Instructions  (From admission, onward)        Start     Ordered   05/20/17 0000  Discharge wound care:    Comments:  Leave the wound open to air. Shower daily with the wound  uncovered. Water and soapy water should run over the incision area. Do not wash directly on the incision for 2 weeks. Remove the glue after 2 weeks.   05/20/17 0913       Signed: Hewitt Shorts 05/20/2017, 9:13 AM

## 2017-05-20 NOTE — Progress Notes (Signed)
Patient is discharged from room 3C06 at this time. Alert and in stable condition. IV site d/c'd and instructions read to patient and daughter with understanding verbalized. Left unit via wheelchair with all belongings at side. 

## 2017-05-21 NOTE — Anesthesia Postprocedure Evaluation (Signed)
Anesthesia Post Note  Patient: Estrella DeedsGeraldine A Montour  Procedure(s) Performed: LAMINECTOMY AND FORAMINOTOMY LUMBAR THREE- LUMBAR FOUR (N/A )     Patient location during evaluation: PACU Anesthesia Type: General Level of consciousness: awake and alert Pain management: pain level controlled Vital Signs Assessment: post-procedure vital signs reviewed and stable Respiratory status: spontaneous breathing, nonlabored ventilation, respiratory function stable and patient connected to nasal cannula oxygen Cardiovascular status: blood pressure returned to baseline and stable Postop Assessment: no apparent nausea or vomiting Anesthetic complications: no    Last Vitals:  Vitals:   05/20/17 0330 05/20/17 0807  BP: (!) 84/57 (!) 89/51  Pulse: 79 77  Resp: 18 16  Temp: 36.9 C 36.7 C  SpO2: 93% 95%    Last Pain:  Vitals:   05/20/17 1000  TempSrc:   PainSc: 2                  Beryle Lathehomas E Ameia Morency

## 2017-05-22 ENCOUNTER — Encounter (HOSPITAL_COMMUNITY): Payer: Self-pay | Admitting: Neurosurgery

## 2019-05-05 ENCOUNTER — Ambulatory Visit: Payer: Medicare PPO | Attending: Internal Medicine

## 2019-05-05 DIAGNOSIS — Z23 Encounter for immunization: Secondary | ICD-10-CM | POA: Insufficient documentation

## 2019-05-05 NOTE — Progress Notes (Signed)
   Covid-19 Vaccination Clinic  Name:  TAJANA CROTTEAU    MRN: [id number] DOB: [birth year]  05/05/2019  Ms. Fogg was observed post Covid-19 immunization for 15 minutes without incidence. She was provided with Vaccine Information Sheet and instruction to access the V-Safe system.   Ms. Salvi was instructed to call 911 with any severe reactions post vaccine: Marland Kitchen Difficulty breathing  . Swelling of your face and throat  . A fast heartbeat  . A bad rash all over your body  . Dizziness and weakness    Immunizations Administered    Name Date Dose VIS Date Route   Pfizer COVID-19 Vaccine 05/05/2019  1:32 PM 0.3 mL 03/01/2019 Intramuscular   Manufacturer: [employer], [employer]   Lot: [id number]   NDC: [address]-3

## 2019-05-28 ENCOUNTER — Ambulatory Visit: Payer: Medicare PPO | Attending: Internal Medicine

## 2019-05-28 DIAGNOSIS — Z23 Encounter for immunization: Secondary | ICD-10-CM

## 2019-05-28 NOTE — Progress Notes (Signed)
   Covid-19 Vaccination Clinic  Name:  DONELLA PASCARELLA    MRN: [id number] DOB: 1941/03/10  05/28/2019  Ms. Bowditch was observed post Covid-19 immunization for 15 minutes without incident. She was provided with Vaccine Information Sheet and instruction to access the V-Safe system.   Ms. Bonito was instructed to call 911 with any severe reactions post vaccine: Marland Kitchen Difficulty breathing  . Swelling of face and throat  . A fast heartbeat  . A bad rash all over body  . Dizziness and weakness   Immunizations Administered    Name Date Dose VIS Date Route   Pfizer COVID-19 Vaccine 05/28/2019  3:16 PM 0.3 mL 03/01/2019 Intramuscular   Manufacturer: [employer], [employer]   Lot: [id number]   NDC: [address]-4

## 2019-05-29 ENCOUNTER — Ambulatory Visit: Payer: Self-pay

## 2019-06-28 ENCOUNTER — Other Ambulatory Visit: Payer: Self-pay | Admitting: Orthopaedic Surgery

## 2019-06-28 DIAGNOSIS — M25511 Pain in right shoulder: Secondary | ICD-10-CM

## 2019-07-12 ENCOUNTER — Other Ambulatory Visit: Payer: Self-pay

## 2019-07-12 ENCOUNTER — Ambulatory Visit
Admission: RE | Admit: 2019-07-12 | Discharge: 2019-07-12 | Disposition: A | Discharge status: Home / Self Care | Payer: Medicare PPO | Source: Ambulatory Visit | Attending: Orthopaedic Surgery | Admitting: Orthopaedic Surgery

## 2019-07-12 DIAGNOSIS — M25511 Pain in right shoulder: Secondary | ICD-10-CM

## 2019-07-24 NOTE — Patient Instructions (Addendum)
DUE TO COVID-19 ONLY ONE VISITOR IS ALLOWED TO COME WITH YOU AND STAY IN THE WAITING ROOM ONLY DURING PRE OP AND PROCEDURE DAY OF SURGERY. THE 2 VISITORS MAY VISIT WITH YOU AFTER SURGERY IN YOUR PRIVATE ROOM DURING VISITING HOURS ONLY!  YOU NEED TO HAVE A COVID 19 TEST ON_5/15______ @__11 :10_____, THIS TEST MUST BE DONE BEFORE SURGERY, COME  [address], [place] , [address].  ([place]) ONCE YOUR COVID TEST IS COMPLETED, PLEASE BEGIN THE QUARANTINE INSTRUCTIONS AS OUTLINED IN YOUR HANDOUT.                Mary Adkins    Your procedure is scheduled on: 08/07/19   Report to [facility] Main  Entrance   Report to admitting at 6:00 AM     Call this number if you have problems the morning of surgery [place], NO CHEWING GUM [place].   Do not eat food After Midnight.   YOU MAY HAVE CLEAR LIQUIDS FROM MIDNIGHT UNTIL 5:30 AM.   At 5:30 AM Please finish the prescribed Pre-Surgery  drink.   Nothing by mouth after you finish the  drink !   Take these medicines the morning of surgery with A SIP OF WATER: Zyrtec                                 You may not have any metal on your body including hair pins and              piercings  Do not wear jewelry, make-up, lotions, powders or perfumes, deodorant             Do not wear nail polish on your fingernails.  Do not shave  48 hours prior to surgery.     Do not bring valuables to the hospital. [place].  Contacts, dentures or bridgework may not be worn into surgery.      Patients discharged the day of surgery will not be allowed to drive home.   IF YOU ARE HAVING SURGERY AND GOING HOME THE SAME DAY, YOU MUST HAVE AN ADULT TO DRIVE YOU HOME AND BE WITH YOU FOR 24 HOURS.   YOU MAY GO HOME BY TAXI OR UBER OR ORTHERWISE, BUT AN ADULT MUST ACCOMPANY YOU HOME AND STAY WITH YOU FOR  24 HOURS.  Name and phone number of your driver:  Special Instructions: N/A              Please read over the following fact sheets you were given: _____________________________________________________________________  [facility]- Preparing for Total Shoulder Arthroplasty    Before surgery, you can play an important role. Because skin is not sterile, your skin needs to be as free of germs as possible. You can reduce the number of germs on your skin by using the following products. . Benzoyl Peroxide Gel o Reduces the number of germs present on the skin o Applied twice a day to shoulder area starting two days before surgery    ==================================================================  Please follow these instructions carefully:  BENZOYL PEROXIDE 5% GEL  Please do not use if you have an allergy to benzoyl peroxide.   If your skin becomes reddened/irritated stop using the  benzoyl peroxide.  Starting two days before surgery, apply as follows: 1. Apply benzoyl peroxide in the morning and at night. Apply after taking a shower. If you are not taking a shower clean entire shoulder front, back, and side along with the armpit with a clean wet washcloth.  2. Place a quarter-sized dollop on your shoulder and rub in thoroughly, making sure to cover the front, back, and side of your shoulder, along with the armpit.   2 days before ____ AM   ____ PM              1 day before ____ AM   ____ PM                         3. Do this twice a day for two days.  (Last application is the night before surgery, AFTER using the CHG soap as described below).  4. Do NOT apply benzoyl peroxide gel on the day of surgery.            [place] - Preparing for Surgery Before surgery, you can play an important role.   Because skin is not sterile, your skin needs to be as free of germs as possible.   You can reduce the number of germs on your skin by washing with CHG (chlorahexidine gluconate) soap  before surgery.   CHG is an antiseptic cleaner which kills germs and bonds with the skin to continue killing germs even after washing. Please DO NOT use if you have an allergy to CHG or antibacterial soaps.   If your skin becomes reddened/irritated stop using the CHG and inform your nurse when you arrive at Short Stay. Do not shave (including legs and underarms) for at least 48 hours prior to the first CHG shower. . Please follow these instructions carefully:  1.  Shower with CHG Soap the night before surgery and the  morning of Surgery.  2.  If you choose to wash your hair, wash your hair first as usual with your  normal  shampoo.  3.  After you shampoo, rinse your hair and body thoroughly to remove the  shampoo.                                        4.  Use CHG as you would any other liquid soap.  You can apply chg directly  to the skin and wash                       Gently with a scrungie or clean washcloth.  5.  Apply the CHG Soap to your body ONLY FROM THE NECK DOWN.   Do not use on face/ open                           Wound or open sores. Avoid contact with eyes, ears mouth and genitals (private parts).                       Wash face,  Genitals (private parts) with your normal soap.             6.  Wash thoroughly, paying special attention to the area where your surgery  will be performed.  7.  Thoroughly rinse your body with warm water  from the neck down.  8.  DO NOT shower/wash with your normal soap after using and rinsing off  the CHG Soap.             9.  Pat yourself dry with a clean towel.            10.  Wear clean pajamas.            11.  Place clean sheets on your bed the night of your first shower and do not  sleep with pets. Day of Surgery : Do not apply any lotions/deodorants the morning of surgery.  Please wear clean clothes to the hospital/surgery center.  FAILURE TO FOLLOW THESE INSTRUCTIONS MAY RESULT IN THE CANCELLATION OF YOUR SURGERY PATIENT  SIGNATURE_________________________________  NURSE SIGNATURE__________________________________  ________________________________________________________________________   Mary Adkins  An incentive spirometer is a tool that can help keep your lungs clear and active. This tool measures how well you are filling your lungs with each breath. Taking long deep breaths may help reverse or decrease the chance of developing breathing (pulmonary) problems (especially infection) following:  A long period of time when you are unable to move or be active. BEFORE THE PROCEDURE   If the spirometer includes an indicator to show your best effort, your nurse or respiratory therapist will set it to a desired goal.  If possible, sit up straight or lean slightly forward. Try not to slouch.  Hold the incentive spirometer in an upright position. INSTRUCTIONS FOR USE  1. Sit on the edge of your bed if possible, or sit up as far as you can in bed or on a chair. 2. Hold the incentive spirometer in an upright position. 3. Breathe out normally. 4. Place the mouthpiece in your mouth and seal your lips tightly around it. 5. Breathe in slowly and as deeply as possible, raising the piston or the ball toward the top of the column. 6. Hold your breath for 3-5 seconds or for as long as possible. Allow the piston or ball to fall to the bottom of the column. 7. Remove the mouthpiece from your mouth and breathe out normally. 8. Rest for a few seconds and repeat Steps 1 through 7 at least 10 times every 1-2 hours when you are awake. Take your time and take a few normal breaths between deep breaths. 9. The spirometer may include an indicator to show your best effort. Use the indicator as a goal to work toward during each repetition. 10. After each set of 10 deep breaths, practice coughing to be sure your lungs are clear. If you have an incision (the cut made at the time of surgery), support your incision when coughing  by placing a pillow or rolled up towels firmly against it. Once you are able to get out of bed, walk around indoors and cough well. You may stop using the incentive spirometer when instructed by your caregiver.  RISKS AND COMPLICATIONS  Take your time so you do not get dizzy or light-headed.  If you are in pain, you may need to take or ask for pain medication before doing incentive spirometry. It is harder to take a deep breath if you are having pain. AFTER USE  Rest and breathe slowly and easily.  It can be helpful to keep track of a log of your progress. Your caregiver can provide you with a simple table to help with this. If you are using the spirometer at home, follow these instructions: SEEK MEDICAL CARE IF:  You are having difficultly using the spirometer.  You have trouble using the spirometer as often as instructed.  Your pain medication is not giving enough relief while using the spirometer.  You develop fever of 100.5 F (38.1 C) or higher. SEEK IMMEDIATE MEDICAL CARE IF:   You cough up bloody sputum that had not been present before.  You develop fever of 102 F (38.9 C) or greater.  You develop worsening pain at or near the incision site. MAKE SURE YOU:   Understand these instructions.  Will watch your condition.  Will get help right away if you are not doing well or get worse. Document Released: 07/18/2006 Document Revised: 05/30/2011 Document Reviewed: 09/18/2006 [facility] Patient Information 2014 [place], [place].   ________________________________________________________________________

## 2019-07-25 ENCOUNTER — Other Ambulatory Visit: Payer: Self-pay

## 2019-07-25 ENCOUNTER — Encounter (HOSPITAL_COMMUNITY)
Admission: RE | Admit: 2019-07-25 | Discharge: 2019-07-25 | Disposition: A | Discharge status: Home / Self Care | Payer: Medicare PPO | Source: Ambulatory Visit | Attending: Orthopaedic Surgery | Admitting: Orthopaedic Surgery

## 2019-07-25 ENCOUNTER — Encounter (HOSPITAL_COMMUNITY): Payer: Self-pay

## 2019-07-25 DIAGNOSIS — Z01812 Encounter for preprocedural laboratory examination: Secondary | ICD-10-CM | POA: Diagnosis not present

## 2019-07-25 NOTE — Progress Notes (Signed)
PCP - K. [facility] Cardiologist - no  Chest x-ray - no EKG - 07/01/19 Stress Test - no ECHO - no Cardiac Cath - no  Sleep Study - no CPAP -   Fasting Blood Sugar - NA Checks Blood Sugar _____ times a day  Blood Thinner Instructions:ASA 81 Aspirin Instructions:none Last Dose:  Anesthesia review:   Patient denies shortness of breath, fever, cough and chest pain at PAT appointment  yes Patient verbalized understanding of instructions that were given to them at the PAT appointment. Patient was also instructed that they will need to review over the PAT instructions again at home before surgery. yes

## 2019-07-26 ENCOUNTER — Encounter (HOSPITAL_COMMUNITY)
Admission: RE | Admit: 2019-07-26 | Discharge: 2019-07-26 | Disposition: A | Discharge status: Home / Self Care | Payer: Medicare PPO | Source: Ambulatory Visit | Attending: Orthopaedic Surgery | Admitting: Orthopaedic Surgery

## 2019-07-26 DIAGNOSIS — Z01812 Encounter for preprocedural laboratory examination: Secondary | ICD-10-CM | POA: Diagnosis not present

## 2019-07-26 LAB — CBC
HCT: 42.1 % (ref 36.0–46.0)
Hemoglobin: 13.4 g/dL (ref 12.0–15.0)
MCH: 29.1 pg (ref 26.0–34.0)
MCHC: 31.8 g/dL (ref 30.0–36.0)
MCV: 91.3 fL (ref 80.0–100.0)
Platelets: 273 10*3/uL (ref 150–400)
RBC: 4.61 MIL/uL (ref 3.87–5.11)
RDW: 12.6 % (ref 11.5–15.5)
WBC: 5.8 10*3/uL (ref 4.0–10.5)
nRBC: 0 % (ref 0.0–0.2)

## 2019-07-26 LAB — BASIC METABOLIC PANEL
Anion gap: 8 (ref 5–15)
BUN: 20 mg/dL (ref 8–23)
CO2: 28 mmol/L (ref 22–32)
Calcium: 9.2 mg/dL (ref 8.9–10.3)
Chloride: 99 mmol/L (ref 98–111)
Creatinine, Ser: 0.83 mg/dL (ref 0.44–1.00)
GFR calc Af Amer: >60 mL/min (ref >60)
GFR calc non Af Amer: >60 mL/min (ref >60)
Glucose, Bld: 102 mg/dL — ABNORMAL HIGH (ref 70–99)
Potassium: 5 mmol/L (ref 3.5–5.1)
Sodium: 135 mmol/L (ref 135–145)

## 2019-07-26 LAB — SURGICAL PCR SCREEN
MRSA, PCR: NEGATIVE
Staphylococcus aureus: NEGATIVE

## 2019-07-26 LAB — TYPE AND SCREEN
ABO/RH(D): A POS
Antibody Screen: NEGATIVE

## 2019-07-26 LAB — ABO/RH: ABO/RH(D): A POS

## 2019-08-03 ENCOUNTER — Other Ambulatory Visit (HOSPITAL_COMMUNITY)
Admission: RE | Admit: 2019-08-03 | Discharge: 2019-08-03 | Disposition: A | Discharge status: Home / Self Care | Payer: Medicare PPO | Source: Ambulatory Visit | Attending: Orthopaedic Surgery | Admitting: Orthopaedic Surgery

## 2019-08-03 DIAGNOSIS — Z20822 Contact with and (suspected) exposure to covid-19: Secondary | ICD-10-CM | POA: Insufficient documentation

## 2019-08-03 DIAGNOSIS — Z01812 Encounter for preprocedural laboratory examination: Secondary | ICD-10-CM | POA: Insufficient documentation

## 2019-08-03 LAB — SARS CORONAVIRUS 2 (TAT 6-24 HRS): SARS Coronavirus 2: NEGATIVE

## 2019-08-06 NOTE — H&P (Signed)
PREOPERATIVE H&P  Chief Complaint: DJD RIGHT SHOULDER  HPI: Mary Adkins is a 79 y.o. female who is scheduled for TOTAL SHOULDER ARTHROPLASTY versus Reverse total shoulder  Patient has a past medical history significant for hypertension, spinal stenosis, and family history of anesthesia complications.   She is a 79 year old female who has had a history of a bilateral shoulder pain for quite some time. She has had injections in the past and has not made much progress with these. She had a hip replacement in the past with  Dr. Madelon Lips and did well with that.  She is extraordinarily debilitated by her shoulder.   Her symptoms are rated as moderate to severe, and have been worsening.  This is significantly impairing activities of daily living.    Please see clinic note for further details on this patient's care.    She has elected for surgical management.   Past Medical History:  Diagnosis Date  . 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 Encounter for TB tine test    was + 45 yrs. ago, took med.  . Family history of anesthesia complication    pt's mother "died during anesthesia but they brought her back"  . Heart murmur    per pt. & Dr. Franky Macho  . Hypertension   . Lumbosacral disc disease    Dr Franky Macho  . Neuromuscular disorder (HCC)    hip, back  . Spinal stenosis of lumbar region with neurogenic claudication   . 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 BACK SURGERY  x3 back surgery  . BREAST SURGERY  Left, benign results   . DILATION AND CURETTAGE OF UTERUS    . JOINT REPLACEMENT Right 2015  . LUMBAR LAMINECTOMY/DECOMPRESSION MICRODISCECTOMY N/A 05/19/2017   Procedure: LAMINECTOMY AND FORAMINOTOMY LUMBAR THREE- LUMBAR FOUR;  Surgeon: Coletta Memos, MD;  Location: [facility] OR;  Service: Neurosurgery;  Laterality: N/A;  . TONSILLECTOMY    . TOTAL HIP ARTHROPLASTY Right 06/01/2012   Procedure: RIGHT TOTAL  HIP ARTHROPLASTY WITH AUTOGRAFT;  Surgeon: Thera Flake., MD;  Location: [facility] OR;  Service: Orthopedics;  Laterality: Right;  RIGHT TOTAL HIP REPLACEMENT ANESTHESIA:  GENERAL, PRE/POST OP FEMORAL NERVE   Social History   Socioeconomic History  . Marital status: Widowed    Spouse name: Not on file  . Number of children: Not on file  . Years of education: Not on file  . Highest education level: Not on file  Occupational History  . Not on file  Tobacco Use  . Smoking status: Former Smoker    Types: Cigarettes    Quit date: 05/04/2011    Years since quitting: 8.2  . Smokeless tobacco: Never Used  Substance and Sexual Activity  . Alcohol use: No  . Drug use: No  . Sexual activity: Not on file  Other Topics Concern  . Not on file  Social History Narrative  . Not on file   Social Determinants of Health   Financial Resource Strain:   . Difficulty of Paying Living Expenses:   Food Insecurity:   . Worried About Programme researcher, broadcasting/film/video in the Last Year:   . Barista in the Last Year:   Transportation Needs:   . Freight forwarder (Medical):   Marland Kitchen Lack of Transportation (Non-Medical):   Physical Activity:   . Days of Exercise per Week:   .  Minutes of Exercise per Session:   Stress:   . Feeling of Stress :   Social Connections:   . Frequency of Communication with Friends and Family:   . Frequency of Social Gatherings with Friends and Family:   . Attends Religious Services:   . Active Member of Clubs or Organizations:   . Attends Banker Meetings:   Marland Kitchen Marital Status:    Family History  Problem Relation Age of Onset  . Heart attack Mother   . Lung cancer Father    Allergies  Allergen Reactions  . Acetazolamide Swelling and Other (See Comments)    Face swelled and peeled  . Amlodipine Other (See Comments)    Dizziness  . Atorvastatin Other (See Comments) and Nausea And Vomiting    Muscle aches  . Penicillins Rash and Other (See Comments)    NUMBNESS  IN ARMS Has patient had a PCN reaction causing immediate rash, facial/tongue/throat swelling, SOB or lightheadedness with hypotension: Unknown Has patient had a PCN reaction causing severe rash involving mucus membranes or skin necrosis: Unknown Has patient had a PCN reaction that required hospitalization: No Has patient had a PCN reaction occurring within the last 10 years: No    . Prednisone Nausea And Vomiting and Palpitations    TACHYCARDIA   . Rosuvastatin Other (See Comments) and Nausea And Vomiting    Muscle aches  . Codeine Nausea And Vomiting  . Diazepam Other (See Comments)    Excessive sleepiness   Prior to Admission medications   Medication Sig Start Date End Date Taking? Authorizing Provider  aspirin EC 81 MG tablet Take 81 mg by mouth daily.   Yes [provider]  cetirizine (ZYRTEC) 10 MG tablet Take 10 mg by mouth daily at 2 PM.   Yes [provider]  cholecalciferol (VITAMIN D) 1000 UNITS tablet Take 1,000 Units by mouth daily.   Yes [provider]  diclofenac (VOLTAREN) 75 MG EC tablet Take 75 mg by mouth 2 (two) times daily. 09/09/14  Yes [provider]  ezetimibe (ZETIA) 10 MG tablet Take 10 mg by mouth daily. 06/23/19  Yes [provider]  nicotine polacrilex (NICORETTE) 2 MG gum Take 2 mg by mouth as needed for smoking cessation.    Yes [provider]  vitamin B-12 (CYANOCOBALAMIN) 500 MCG tablet Take 250 mcg by mouth daily.    Yes [provider]    ROS: All other systems have been reviewed and were otherwise negative with the exception of those mentioned in the HPI and as above.  Physical Exam: General: Alert, no acute distress Cardiovascular: No pedal edema Respiratory: No cyanosis, no use of accessory musculature GI: No organomegaly, abdomen is soft and non-tender Skin: No lesions in the area of chief complaint Neurologic: Sensation intact distally Psychiatric: Patient is competent for consent  with normal mood and affect Lymphatic: No axillary or cervical lymphadenopathy  MUSCULOSKELETAL:  Right shoulder: active forward elevation to 60, contralateral 120. External rotation to the right is 0 and left is 30. Internal rotation right is to the back pocket and left to T10.  Cuff strength is seemingly intact bilaterally.   Imaging: X-rays of the right shoulder demonstrates end stage bone on bone osteoarthritis   Assessment: DJD RIGHT SHOULDER  Plan: Plan for Procedure(s): TOTAL SHOULDER ARTHROPLASTY versus Reverse total shoulder  The risks benefits and alternatives were discussed with the patient including but not limited to the risks of nonoperative treatment, versus surgical intervention including infection,  bleeding, nerve injury,  blood clots, cardiopulmonary complications, morbidity, mortality, among others, and they were willing to proceed.   We additionally specifically discussed risks of axillary nerve injury, infection, periprosthetic fracture, continued pain and longevity of implants prior to beginning procedure.    Patient will be monitored closely in PACU for pain control and medical stabilization. If patient found to be stable in PACU, she may be discharged home with family from PACU. If any concern in PACU, patient will be admitted for observation after surgery. The patient is planning to be discharged home with outpatient PT.   The patient acknowledged the explanation, agreed to proceed with the plan and consent was signed.   Patient received pre-operative clearance from her PCP, Carolee Rota NP, prior to surgery. Patient's dentist, Dr. Rolm Gala, cleared her for surgery as well, but noted "maxillary anterior teeth will fracture if you place any lateral forces with intubation."  Operative Plan: Right total shoulder arthroplasty versus reverse total shoulder Discharge Medications: Tylenol, Celebrex, Oxycodone, Zofran DVT Prophylaxis: Aspirin Physical Therapy:  Outpatient PT Special Discharge needs: [place], PA-C  08/06/2019 7:10 AM

## 2019-08-07 ENCOUNTER — Ambulatory Visit (HOSPITAL_COMMUNITY): Payer: Medicare PPO

## 2019-08-07 ENCOUNTER — Ambulatory Visit (HOSPITAL_COMMUNITY): Payer: Medicare PPO | Admitting: Certified Registered"

## 2019-08-07 ENCOUNTER — Encounter (HOSPITAL_COMMUNITY)
Admission: RE | Disposition: A | Discharge status: Home / Self Care | Payer: Self-pay | Source: Ambulatory Visit | Attending: Orthopaedic Surgery

## 2019-08-07 ENCOUNTER — Encounter (HOSPITAL_COMMUNITY): Payer: Self-pay | Admitting: Orthopaedic Surgery

## 2019-08-07 ENCOUNTER — Ambulatory Visit (HOSPITAL_COMMUNITY)
Admission: RE | Admit: 2019-08-07 | Discharge: 2019-08-07 | Disposition: A | Discharge status: Home / Self Care | Payer: Medicare PPO | Source: Ambulatory Visit | Attending: Orthopaedic Surgery | Admitting: Orthopaedic Surgery

## 2019-08-07 DIAGNOSIS — Z7982 Long term (current) use of aspirin: Secondary | ICD-10-CM | POA: Diagnosis not present

## 2019-08-07 DIAGNOSIS — Z880 Allergy status to penicillin: Secondary | ICD-10-CM | POA: Diagnosis not present

## 2019-08-07 DIAGNOSIS — I10 Essential (primary) hypertension: Secondary | ICD-10-CM | POA: Diagnosis not present

## 2019-08-07 DIAGNOSIS — Z87891 Personal history of nicotine dependence: Secondary | ICD-10-CM | POA: Insufficient documentation

## 2019-08-07 DIAGNOSIS — Z791 Long term (current) use of non-steroidal anti-inflammatories (NSAID): Secondary | ICD-10-CM | POA: Insufficient documentation

## 2019-08-07 DIAGNOSIS — Z09 Encounter for follow-up examination after completed treatment for conditions other than malignant neoplasm: Secondary | ICD-10-CM

## 2019-08-07 DIAGNOSIS — M19011 Primary osteoarthritis, right shoulder: Secondary | ICD-10-CM | POA: Diagnosis not present

## 2019-08-07 DIAGNOSIS — Z885 Allergy status to narcotic agent status: Secondary | ICD-10-CM | POA: Diagnosis not present

## 2019-08-07 DIAGNOSIS — Z96641 Presence of right artificial hip joint: Secondary | ICD-10-CM | POA: Diagnosis not present

## 2019-08-07 DIAGNOSIS — Z8673 Personal history of transient ischemic attack (TIA), and cerebral infarction without residual deficits: Secondary | ICD-10-CM | POA: Insufficient documentation

## 2019-08-07 DIAGNOSIS — Z888 Allergy status to other drugs, medicaments and biological substances status: Secondary | ICD-10-CM | POA: Diagnosis not present

## 2019-08-07 HISTORY — PX: TOTAL SHOULDER ARTHROPLASTY: SHX126

## 2019-08-07 SURGERY — ARTHROPLASTY, SHOULDER, TOTAL
Anesthesia: General | Site: Shoulder | Laterality: Right

## 2019-08-07 MED ORDER — ONDANSETRON HCL 4 MG/2ML IJ SOLN: 4.0000 mg | Freq: Once | INTRAMUSCULAR | Status: DC | PRN

## 2019-08-07 MED ORDER — FENTANYL CITRATE (PF) 100 MCG/2ML IJ SOLN
INTRAMUSCULAR | Status: AC
  Filled 2019-08-07: qty 2

## 2019-08-07 MED ORDER — PROPOFOL 10 MG/ML IV BOLUS
INTRAVENOUS | Status: DC | PRN
  Administered 2019-08-07: 130 mg via INTRAVENOUS

## 2019-08-07 MED ORDER — OXYCODONE HCL 5 MG/5ML PO SOLN: 5.0000 mg | Freq: Once | ORAL | Status: DC | PRN

## 2019-08-07 MED ORDER — ONDANSETRON HCL 4 MG PO TABS: 4.0000 mg | ORAL_TABLET | Freq: Three times a day (TID) | ORAL | 1 refills | Status: AC | PRN

## 2019-08-07 MED ORDER — BUPIVACAINE LIPOSOME 1.3 % IJ SUSP
INTRAMUSCULAR | Status: DC | PRN
  Administered 2019-08-07: 10 mL

## 2019-08-07 MED ORDER — ACETAMINOPHEN 500 MG PO TABS
1000.0000 mg | ORAL_TABLET | Freq: Three times a day (TID) | ORAL | 0 refills | Status: AC
Start: 2019-08-07 — End: 2019-08-21

## 2019-08-07 MED ORDER — DEXAMETHASONE SODIUM PHOSPHATE 10 MG/ML IJ SOLN
INTRAMUSCULAR | Status: DC | PRN
  Administered 2019-08-07: 4 mg via INTRAVENOUS

## 2019-08-07 MED ORDER — FENTANYL CITRATE (PF) 100 MCG/2ML IJ SOLN: 25.0000 ug | INTRAMUSCULAR | Status: DC | PRN

## 2019-08-07 MED ORDER — ROCURONIUM BROMIDE 10 MG/ML (PF) SYRINGE
PREFILLED_SYRINGE | INTRAVENOUS | Status: AC
  Filled 2019-08-07: qty 10

## 2019-08-07 MED ORDER — ROCURONIUM BROMIDE 10 MG/ML (PF) SYRINGE
PREFILLED_SYRINGE | INTRAVENOUS | Status: DC | PRN
  Administered 2019-08-07: 60 mg via INTRAVENOUS

## 2019-08-07 MED ORDER — STERILE WATER FOR IRRIGATION IR SOLN
Status: DC | PRN
  Administered 2019-08-07: 2000 mL

## 2019-08-07 MED ORDER — LACTATED RINGERS IV SOLN: INTRAVENOUS | Status: DC | PRN

## 2019-08-07 MED ORDER — SUGAMMADEX SODIUM 200 MG/2ML IV SOLN
INTRAVENOUS | Status: DC | PRN
  Administered 2019-08-07: 150 mg via INTRAVENOUS

## 2019-08-07 MED ORDER — LIDOCAINE 2% (20 MG/ML) 5 ML SYRINGE
INTRAMUSCULAR | Status: AC
  Filled 2019-08-07: qty 5

## 2019-08-07 MED ORDER — PHENYLEPHRINE 40 MCG/ML (10ML) SYRINGE FOR IV PUSH (FOR BLOOD PRESSURE SUPPORT)
PREFILLED_SYRINGE | INTRAVENOUS | Status: DC | PRN
  Administered 2019-08-07 (×2): 80 ug via INTRAVENOUS

## 2019-08-07 MED ORDER — PHENYLEPHRINE 40 MCG/ML (10ML) SYRINGE FOR IV PUSH (FOR BLOOD PRESSURE SUPPORT)
PREFILLED_SYRINGE | INTRAVENOUS | Status: AC
  Filled 2019-08-07: qty 10

## 2019-08-07 MED ORDER — MIDAZOLAM HCL 2 MG/2ML IJ SOLN
INTRAMUSCULAR | Status: AC
  Filled 2019-08-07: qty 2

## 2019-08-07 MED ORDER — ASPIRIN 81 MG PO CHEW
81.0000 mg | CHEWABLE_TABLET | Freq: Two times a day (BID) | ORAL | 0 refills | Status: AC
Start: 2019-08-07 — End: 2019-09-18

## 2019-08-07 MED ORDER — FENTANYL CITRATE (PF) 250 MCG/5ML IJ SOLN
INTRAMUSCULAR | Status: DC | PRN
  Administered 2019-08-07: 50 ug via INTRAVENOUS

## 2019-08-07 MED ORDER — PHENYLEPHRINE HCL (PRESSORS) 10 MG/ML IV SOLN
INTRAVENOUS | Status: AC
  Filled 2019-08-07: qty 1

## 2019-08-07 MED ORDER — OXYCODONE HCL 5 MG PO TABS: 5.0000 mg | ORAL_TABLET | Freq: Once | ORAL | Status: DC | PRN

## 2019-08-07 MED ORDER — EPHEDRINE SULFATE-NACL 50-0.9 MG/10ML-% IV SOSY
PREFILLED_SYRINGE | INTRAVENOUS | Status: DC | PRN
  Administered 2019-08-07 (×3): 5 mg via INTRAVENOUS

## 2019-08-07 MED ORDER — PHENYLEPHRINE HCL-NACL 10-0.9 MG/250ML-% IV SOLN
INTRAVENOUS | Status: DC | PRN
  Administered 2019-08-07: 20 ug/min via INTRAVENOUS

## 2019-08-07 MED ORDER — MIDAZOLAM HCL 2 MG/2ML IJ SOLN
INTRAMUSCULAR | Status: DC | PRN
  Administered 2019-08-07 (×2): 1 mg via INTRAVENOUS

## 2019-08-07 MED ORDER — ONDANSETRON HCL 4 MG/2ML IJ SOLN
INTRAMUSCULAR | Status: DC | PRN
  Administered 2019-08-07: 4 mg via INTRAVENOUS

## 2019-08-07 MED ORDER — EPHEDRINE 5 MG/ML INJ
INTRAVENOUS | Status: AC
  Filled 2019-08-07: qty 10

## 2019-08-07 MED ORDER — DICLOFENAC SODIUM 75 MG PO TBEC: 75.0000 mg | DELAYED_RELEASE_TABLET | Freq: Two times a day (BID) | ORAL | 0 refills | Status: AC

## 2019-08-07 MED ORDER — PROPOFOL 10 MG/ML IV BOLUS
INTRAVENOUS | Status: AC
  Filled 2019-08-07: qty 20

## 2019-08-07 MED ORDER — DEXAMETHASONE SODIUM PHOSPHATE 10 MG/ML IJ SOLN
INTRAMUSCULAR | Status: AC
  Filled 2019-08-07: qty 1

## 2019-08-07 MED ORDER — VANCOMYCIN HCL 1 G IV SOLR
INTRAVENOUS | Status: DC | PRN
  Administered 2019-08-07: 1000 mg via TOPICAL

## 2019-08-07 MED ORDER — VANCOMYCIN HCL 1000 MG IV SOLR
INTRAVENOUS | Status: AC
  Filled 2019-08-07: qty 1000

## 2019-08-07 MED ORDER — ONDANSETRON HCL 4 MG/2ML IJ SOLN
INTRAMUSCULAR | Status: AC
  Filled 2019-08-07: qty 2

## 2019-08-07 MED ORDER — SODIUM CHLORIDE 0.9 % IR SOLN
Status: DC | PRN
  Administered 2019-08-07: 3000 mL

## 2019-08-07 MED ORDER — TRANEXAMIC ACID-NACL 1000-0.7 MG/100ML-% IV SOLN
1000.0000 mg | INTRAVENOUS | Status: AC
  Administered 2019-08-07: 1000 mg via INTRAVENOUS
  Filled 2019-08-07: qty 100

## 2019-08-07 MED ORDER — OXYCODONE HCL 5 MG PO TABS: ORAL_TABLET | ORAL | 0 refills | Status: AC

## 2019-08-07 MED ORDER — 0.9 % SODIUM CHLORIDE (POUR BTL) OPTIME
TOPICAL | Status: DC | PRN
  Administered 2019-08-07: 1000 mL

## 2019-08-07 MED ORDER — BUPIVACAINE HCL (PF) 0.5 % IJ SOLN
INTRAMUSCULAR | Status: DC | PRN
  Administered 2019-08-07: 15 mL via PERINEURAL

## 2019-08-07 MED ORDER — CLINDAMYCIN PHOSPHATE 900 MG/50ML IV SOLN
900.0000 mg | INTRAVENOUS | Status: AC
  Administered 2019-08-07: 900 mg via INTRAVENOUS
  Filled 2019-08-07: qty 50

## 2019-08-07 MED ORDER — LIDOCAINE 2% (20 MG/ML) 5 ML SYRINGE
INTRAMUSCULAR | Status: DC | PRN
  Administered 2019-08-07: 40 mg via INTRAVENOUS

## 2019-08-07 SURGICAL SUPPLY — 71 items
BASEPLATE GLENOID RSA 3X25 0D (Shoulder) ×1 IMPLANT
BIT DRILL 3.2 PERIPHERAL SCREW (BIT) ×1 IMPLANT
BLADE SAW SAG 73X25 THK (BLADE) ×1
BLADE SAW SGTL 73X25 THK (BLADE) ×1 IMPLANT
CHLORAPREP W/TINT 26 (MISCELLANEOUS) ×4 IMPLANT
CLSR STERI-STRIP ANTIMIC 1/2X4 (GAUZE/BANDAGES/DRESSINGS) ×2 IMPLANT
COOLER ICEMAN CLASSIC (MISCELLANEOUS) ×1 IMPLANT
COVER BACK TABLE 60X90IN (DRAPES) ×2 IMPLANT
COVER SURGICAL LIGHT HANDLE (MISCELLANEOUS) ×2 IMPLANT
COVER WAND RF STERILE (DRAPES) ×2 IMPLANT
DRAPE C-ARM 42X120 X-RAY (DRAPES) IMPLANT
DRAPE INCISE IOBAN 66X45 STRL (DRAPES) ×4 IMPLANT
DRAPE ORTHO SPLIT 77X108 STRL (DRAPES) ×2
DRAPE SHEET LG 3/4 BI-LAMINATE (DRAPES) ×4 IMPLANT
DRAPE SURG ORHT 6 SPLT 77X108 (DRAPES) ×2 IMPLANT
DRESSING AQUACEL AG SP 3.5X6 (GAUZE/BANDAGES/DRESSINGS) IMPLANT
DRSG AQUACEL AG ADV 3.5X 6 (GAUZE/BANDAGES/DRESSINGS) ×2 IMPLANT
DRSG AQUACEL AG SP 3.5X6 (GAUZE/BANDAGES/DRESSINGS) ×2
ELECT BLADE TIP CTD 4 INCH (ELECTRODE) ×2 IMPLANT
ELECT REM PT RETURN 15FT ADLT (MISCELLANEOUS) ×2 IMPLANT
GLENOSPHERE REV SHOULDER 36 (Joint) ×1 IMPLANT
GLOVE BIO SURGEON STRL SZ7 (GLOVE) ×4 IMPLANT
GLOVE BIOGEL PI IND STRL 7.0 (GLOVE) ×1 IMPLANT
GLOVE BIOGEL PI IND STRL 8 (GLOVE) ×1 IMPLANT
GLOVE BIOGEL PI INDICATOR 7.0 (GLOVE) ×1
GLOVE BIOGEL PI INDICATOR 8 (GLOVE) ×1
GLOVE ECLIPSE 8.0 STRL XLNG CF (GLOVE) ×4 IMPLANT
GOWN STRL REUS W/TWL LRG LVL3 (GOWN DISPOSABLE) ×4 IMPLANT
GUIDEWIRE GLENOID 2.5X220 (WIRE) ×1 IMPLANT
HANDPIECE INTERPULSE COAX TIP (DISPOSABLE) ×1
IMPL REVERSE SHOULDER 0X3.5 (Shoulder) IMPLANT
IMPLANT REVERSE SHOULDER 0X3.5 (Shoulder) ×2 IMPLANT
INSERT HUMERAL 36X6MM 12.5DEG (Insert) ×1 IMPLANT
KIT BASIN (CUSTOM PROCEDURE TRAY) ×2 IMPLANT
KIT STABILIZATION SHOULDER (MISCELLANEOUS) ×2 IMPLANT
KIT TURNOVER KIT A (KITS) ×2 IMPLANT
MANIFOLD NEPTUNE II (INSTRUMENTS) ×2 IMPLANT
NDL HYPO 25X1 1.5 SAFETY (NEEDLE) IMPLANT
NDL MAYO CATGUT SZ4 TPR NDL (NEEDLE) ×1 IMPLANT
NEEDLE HYPO 25X1 1.5 SAFETY (NEEDLE) IMPLANT
NEEDLE MAYO CATGUT SZ4 (NEEDLE) ×2 IMPLANT
NS IRRIG 1000ML POUR BTL (IV SOLUTION) ×2 IMPLANT
PACK SHOULDER (CUSTOM PROCEDURE TRAY) ×2 IMPLANT
PAD COLD SHLDR WRAP-ON (PAD) ×1 IMPLANT
RESTRAINT HEAD UNIVERSAL NS (MISCELLANEOUS) ×2 IMPLANT
SCREW 5.0X18 (Screw) ×1 IMPLANT
SCREW 5.5X14 (Screw) ×1 IMPLANT
SCREW 5.5X26 (Screw) ×1 IMPLANT
SCREW BONE INTRNL SM 7 (Screw) ×1 IMPLANT
SCREW PERIPHERAL 30 (Screw) ×1 IMPLANT
SET HNDPC FAN SPRY TIP SCT (DISPOSABLE) ×1 IMPLANT
SLING ULTRA III MED (ORTHOPEDIC SUPPLIES) ×1 IMPLANT
SMARTMIX MINI TOWER (MISCELLANEOUS) ×2
SPONGE LAP 18X18 RF (DISPOSABLE) IMPLANT
STEM HUMERAL AEQUALIS 5BX82 (Stem) ×1 IMPLANT
SUCTION FRAZIER HANDLE 12FR (TUBING) ×1
SUCTION TUBE FRAZIER 12FR DISP (TUBING) ×1 IMPLANT
SUT ETHIBOND 2 V 37 (SUTURE) ×2 IMPLANT
SUT ETHIBOND NAB CT1 #1 30IN (SUTURE) ×2 IMPLANT
SUT FIBERWIRE #5 38 CONV NDL (SUTURE) ×8
SUT MNCRL AB 4-0 PS2 18 (SUTURE) ×2 IMPLANT
SUT VIC AB 0 CT1 36 (SUTURE) ×2 IMPLANT
SUT VIC AB 3-0 CT1 27 (SUTURE) ×1
SUT VIC AB 3-0 CT1 TAPERPNT 27 (SUTURE) ×1 IMPLANT
SUT VIC AB 3-0 SH 27 (SUTURE)
SUT VIC AB 3-0 SH 27X BRD (SUTURE) IMPLANT
SUTURE FIBERWR #5 38 CONV NDL (SUTURE) IMPLANT
TOWEL OR 17X26 10 PK STRL BLUE (TOWEL DISPOSABLE) ×2 IMPLANT
TOWER CARTRIDGE SMART MIX (DISPOSABLE) IMPLANT
TOWER SMARTMIX MINI (MISCELLANEOUS) ×1 IMPLANT
WATER STERILE IRR 1000ML POUR (IV SOLUTION) ×2 IMPLANT

## 2019-08-07 NOTE — Transfer of Care (Signed)
Immediate Anesthesia Transfer of Care Note  Patient: Mary Adkins  Procedure(s) Performed: TOTAL SHOULDER ARTHROPLASTY (Right Shoulder)  Patient Location: PACU  Anesthesia Type:General  Level of Consciousness: awake, drowsy and patient cooperative  Airway & Oxygen Therapy: Patient Spontanous Breathing and Patient connected to face mask oxygen  Post-op Assessment: Report given to RN and Post -op Vital signs reviewed and stable  Post vital signs: Reviewed and stable  Last Vitals:  Vitals Value Taken Time  BP 131/75 08/07/19 1017  Temp    Pulse 83 08/07/19 1018  Resp 12 08/07/19 1018  SpO2 100 % 08/07/19 1018  Vitals shown include unvalidated device data.  Last Pain:  Vitals:   08/07/19 0651  TempSrc: Oral  PainSc:          Complications: No apparent anesthesia complications

## 2019-08-07 NOTE — Anesthesia Postprocedure Evaluation (Signed)
Anesthesia Post Note  Patient: Mary Adkins  Procedure(s) Performed: TOTAL SHOULDER ARTHROPLASTY (Right Shoulder)     Patient location during evaluation: PACU Anesthesia Type: General Level of consciousness: awake and alert Pain management: pain level controlled Vital Signs Assessment: post-procedure vital signs reviewed and stable Respiratory status: spontaneous breathing, nonlabored ventilation and respiratory function stable Cardiovascular status: blood pressure returned to baseline and stable Postop Assessment: no apparent nausea or vomiting Anesthetic complications: no    Last Vitals:  Vitals:   08/07/19 1115 08/07/19 1124  BP:  110/80  Pulse: 72   Resp: 13   Temp: (!) 36.3 C (!) 36.3 C  SpO2: 92% 95%    Last Pain:  Vitals:   08/07/19 1124  TempSrc:   PainSc: 0-No pain                 Lucretia Kern

## 2019-08-07 NOTE — Op Note (Signed)
Orthopaedic Surgery Operative Note (CSN: [id number])  KIYARA BOUFFARD  05-12-1940 Date of Surgery: 08/07/2019   Diagnoses:  DJD RIGHT SHOULDER  Procedure: Right reverse total Shoulder Arthroplasty with ingrowth post   Operative Finding Successful completion of planned procedure.  Patient's blueprint planning demonstrated that a total arthroplasty implant would not fit appropriately and would have violated her vault as she was significantly medialized.  We had good fit with her ingrowth post.  Were happy with the overall construct and the actual nerve was intact at the end of the case.  Post-operative plan: The patient will be NWB in sling.  The patient will be will be discharged from PACU if continues to be stable as was plan prior to surgery.  DVT prophylaxis Aspirin 81 mg twice daily for 6 weeks.  Pain control with PRN pain medication preferring oral medicines.  Follow up plan will be scheduled in approximately 7 days for incision check and XR.  Physical therapy to start after 4 weeks.  Implants: 25+3 baseplate, 7 mm ingrowth post, 4 peripheral screws, 36 standard glenosphere and a size 5 stem with a high offset 0 tray and a 36+6 poly-  Post-Op Diagnosis: Same Surgeons:Primary: Bjorn Pippin, MD Assistants:Caroline McBane PA-C Location: [facility] ROOM 06 Anesthesia: General with Exparel Interscalene Antibiotics: Clindamycin, Vancomycin 1000mg  locally Tourniquet time: None Estimated Blood Loss: 100 Complications: None Specimens: None Implants: Implant Name Type Inv. Item Serial No. Manufacturer Lot No. LRB No. Used Action  SCREW BONE INTRNL SM 7 - Screw SCREW BONE INTRNL SM 7  [employer] [id number] Right 1 Implanted  BASEPLATE GLENOID RSA 3X25 0D - [id number] Shoulder BASEPLATE GLENOID RSA 3X25 0D  [employer] [id number] Right 1 Implanted  GLENOSPHERE REV SHOULDER 36 - [id number] Joint GLENOSPHERE REV SHOULDER 36  [employer] [id number] Right 1 Implanted  SCREW 5.0X18 -  [id number] Screw SCREW 5.0X18  [employer]  Right 1 Implanted  SCREW 5.5X14 - [id number] Screw SCREW 5.5X14  [employer]  Right 1 Implanted  SCREW PERIPHERAL 30 - [id number] Screw SCREW PERIPHERAL 30  [employer]  Right 1 Implanted  SCREW 5.5X26 - [id number] Screw SCREW 5.5X26  [employer]  Right 1 Implanted  IMPLANT REVERSE SHOULDER 0X3.5 - [id number] Shoulder IMPLANT REVERSE SHOULDER 0X3.5  [employer] [id number] Right 1 Implanted  STEM HUMERAL AEQUALIS 5BX82 - [id number] Stem STEM HUMERAL AEQUALIS 5BX82  [employer] [id number] Right 1 Implanted  INSERT HUMERAL 36X6MM 12.5DEG - [id number] Insert INSERT HUMERAL 36X6MM [id number] [facility] Right 1 Implanted    Indications for Surgery:   KOULA VENIER is a 79 y.o. female with end-stage bone-on-bone arthritis of the right shoulder with preoperative planning that demonstrated significant glenoid wear.  Benefits and risks of operative and nonoperative management were discussed prior to surgery with patient/guardian(s) and informed consent form was completed.  Infection and need for further surgery were discussed as was prosthetic stability and cuff issues.  We additionally specifically discussed risks of axillary nerve injury, infection, periprosthetic fracture, continued pain and longevity of implants prior to beginning procedure.      Procedure:   The patient was identified in the preoperative holding area where the surgical site was marked. Block placed by anesthesia with exparel.  The patient was taken to the OR where a procedural timeout was called and the above noted anesthesia was induced.  The patient was positioned beachchair on allen table with spider arm positioner.  Preoperative antibiotics were dosed.  The patient's right shoulder was  prepped and draped in the usual sterile fashion.  A second preoperative timeout was called.       Standard deltopectoral approach was performed with a #10 blade. We dissected down to the  subcutaneous tissues and the cephalic vein was taken laterally with the deltoid. Clavipectoral fascia was incised in line with the incision. Deep retractors were placed. The long of the biceps tendon was identified and there was significant tenosynovitis present.  Tenodesis was performed to the pectoralis tendon with #2 Ethibond. The remaining biceps was followed up into the rotator interval where it was released.   The subscapularis was taken down in a full thickness layer with capsule along the humeral neck extending inferiorly around the humeral head. We continued releasing the capsule directly off of the osteophytes inferiorly all the way around the corner. This allowed [place] to dislocate the humeral head.   The humeral head had evidence of severe osteoarthritic wear with full-thickness cartilage loss and exposed subchondral bone. There was significant flattening of the humeral head.   Based on the patient's preoperative planning she was not really a candidate for a total arthroplasty secondary to her terrible glenoid wear.  Rotator cuff was mildly thinned as well and we decided on reverse total shoulder arthroplasty.  There were osteophytes along the inferior humeral neck. The osteophytes were removed with an osteotome and a rongeur.  Osteophytes were removed with a rongeur and an osteotome and the anatomic neck was well visualized.     A humeral cutting guide was inserted down the intramedullary canal. The version was set at 20 of retroversion. Humeral osteotomy was performed with an oscillating saw. The head fragment was passed off the back table. A starter awl was used to open the humeral canal. We next used T-handle straight sound reamers to ream up to an appropriate fit. A chisel was used to remove proximal humeral bone. We then broached starting with a size one broach and broaching up to 5 which obtained an appropriate fit. The broach handle was removed. A cut protector was placed. The broach  handle was removed and a cut protector was placed. The humerus was retracted posteriorly and we turned our attention to glenoid exposure.  The subscapularis was again identified and immediately we took care to palpate the axillary nerve anteriorly and verify its position with gentle palpation as well as the tug test.  We then released the SGHL with bovie cautery prior to placing a curved mayo at the junction of the anterior glenoid well above the axillary nerve and bluntly dissecting the subscapularis from the capsule.  We then carefully protected the axillary nerve as we gently released the inferior capsule to fully mobilize the subscapularis.  An anterior deltoid retractor was then placed as well as a small Hohmann retractor superiorly.   The glenoid was inspected and had evidence of severe osteoarthritic wear with full-thickness cartilage loss and exposed subchondral bone. The remaining labrum was removed circumferentially taking great care not to disrupt the posterior capsule.   The glenoid drill guide was placed and used to drill a guide pin in the center, inferior position. The glenoid face was then reamed concentrically over the guide wire. The center hole was drilled over the guidepin in a near anatomic angle of version.  Loss reamer was used and then the short post reamer was used.  We used a short post implant with 3 mm of additional offset.  Next a 36 mm glenosphere was selected and impacted onto the baseplate.  The center screw was tightened.  We turned attention back to the humeral side. The cut protector was removed. We trialed with multiple size tray and polyethylene options and selected a 6 which provided good stability and range of motion without excess soft tissue tension. The offset was dialed in to match the normal anatomy. The shoulder was trialed.  There was good ROM in all planes and the shoulder was stable with no inferior translation.  The real humeral implants were opened after  again confirming sizes.  The trial was removed. #5 Fiberwire x4 sutures passed through the humeral neck for subscap repair. The humeral component was press-fit obtaining a secure fit. A +0 high offset tray was selected and impacted onto the stem.  A 36+6 polyethylene liner was impacted onto the stem.  The joint was reduced and thoroughly irrigated with pulsatile lavage. Subscap was repaired back with #5 Fiberwire sutures through bone tunnels. Hemostasis was obtained. The deltopectoral interval was reapproximated with #1 Ethibond. The subcutaneous tissues were closed with 2-0 Vicryl and the skin was closed with running monocryl.    The wounds were cleaned and dried and an Aquacel dressing was placed. The drapes taken down. The arm was placed into sling with abduction pillow. Patient was awakened, extubated, and transferred to the recovery room in stable condition. There were no intraoperative complications. The sponge, needle, and attention counts were  correct at the end of the case.        Noemi Chapel, PA-C, present and scrubbed throughout the case, critical for completion in a timely fashion, and for retraction, instrumentation, closure.

## 2019-08-07 NOTE — Anesthesia Procedure Notes (Signed)
Procedure Name: Intubation Date/Time: 08/07/2019 8:30 AM Performed by: Eben Burow, CRNA Pre-anesthesia Checklist: Patient identified, Emergency Drugs available, Suction available, Patient being monitored and Timeout performed Patient Re-evaluated:Patient Re-evaluated prior to induction Oxygen Delivery Method: Circle system utilized Preoxygenation: Pre-oxygenation with 100% oxygen Induction Type: IV induction Ventilation: Mask ventilation without difficulty Laryngoscope Size: Mac and 4 Grade View: Grade I Tube type: Oral Tube size: 7.0 mm Number of attempts: 1 Airway Equipment and Method: Stylet Placement Confirmation: ETT inserted through vocal cords under direct vision,  positive ETCO2 and breath sounds checked- equal and bilateral Secured at: 21 cm Tube secured with: Tape Dental Injury: Teeth and Oropharynx as per pre-operative assessment

## 2019-08-07 NOTE — Interval H&P Note (Signed)
History and Physical Interval Note:  08/07/2019 8:14 AM  Mary Adkins  has presented today for surgery, with the diagnosis of DJD RIGHT SHOULDER.  The various methods of treatment have been discussed with the patient and family. After consideration of risks, benefits and other options for treatment, the patient has consented to  Procedure(s): TOTAL SHOULDER ARTHROPLASTY (Right) as a surgical intervention.  The patient's history has been reviewed, patient examined, no change in status, stable for surgery.  I have reviewed the patient's chart and labs.  Questions were answered to the patient's satisfaction.     Bjorn Pippin

## 2019-08-07 NOTE — Anesthesia Procedure Notes (Signed)
Anesthesia Regional Block: Interscalene brachial plexus block   Pre-Anesthetic Checklist: ,, timeout performed, Correct Patient, Correct Site, Correct Laterality, Correct Procedure, Correct Position, site marked, Risks and benefits discussed,  Surgical consent,  Pre-op evaluation,  At surgeon's request and post-op pain management  Laterality: Right  Prep: chloraprep       Needles:  Injection technique: Single-shot  Needle Type: Echogenic Stimulator Needle     Needle Length: 10cm  Needle Gauge: 20     Additional Needles:   Procedures:,,,, ultrasound used (permanent image in chart),,,,  Narrative:  Start time: 08/07/2019 8:00 AM End time: 08/07/2019 8:05 AM Injection made incrementally with aspirations every 5 mL.  Performed by: Personally  Anesthesiologist: Lucretia Kern, MD  Additional Notes: Monitors applied. Injection made in 5cc increments. No resistance to injection. Good needle visualization. Patient tolerated procedure well.

## 2019-08-07 NOTE — Anesthesia Preprocedure Evaluation (Signed)
Anesthesia Evaluation  Patient identified by MRN, date of birth, ID band Patient awake    Reviewed: Allergy & Precautions, NPO status , Patient's Chart, lab work & pertinent test results  History of Anesthesia Complications Negative for: history of anesthetic complications  Airway Mallampati: II  TM Distance: >3 FB Neck ROM: Full    Dental  (+) Teeth Intact   Pulmonary neg pulmonary ROS, former smoker,    Pulmonary exam normal        Cardiovascular hypertension, Normal cardiovascular exam     Neuro/Psych CVA negative psych ROS   GI/Hepatic negative GI ROS, Neg liver ROS,   Endo/Other  negative endocrine ROS  Renal/GU negative Renal ROS  negative genitourinary   Musculoskeletal  (+) Arthritis ,   Abdominal   Peds  Hematology negative hematology ROS (+)   Anesthesia Other Findings   Reproductive/Obstetrics                            Anesthesia Physical Anesthesia Plan  ASA: III  Anesthesia Plan: General   Post-op Pain Management: GA combined w/ Regional for post-op pain   Induction: Intravenous  PONV Risk Score and Plan: 3 and Ondansetron, Dexamethasone, Treatment may vary due to age or medical condition and Midazolam  Airway Management Planned: Oral ETT  Additional Equipment: None  Intra-op Plan:   Post-operative Plan: Extubation in OR  Informed Consent: I have reviewed the patients History and Physical, chart, labs and discussed the procedure including the risks, benefits and alternatives for the proposed anesthesia with the patient or authorized representative who has indicated his/her understanding and acceptance.     Dental advisory given  Plan Discussed with:   Anesthesia Plan Comments:        Anesthesia Quick Evaluation

## 2019-08-08 ENCOUNTER — Encounter: Payer: Self-pay | Admitting: *Deleted

## 2019-09-05 DIAGNOSIS — M19011 Primary osteoarthritis, right shoulder: Secondary | ICD-10-CM | POA: Diagnosis not present

## 2019-09-10 DIAGNOSIS — Z471 Aftercare following joint replacement surgery: Secondary | ICD-10-CM | POA: Diagnosis not present

## 2019-09-10 DIAGNOSIS — M25511 Pain in right shoulder: Secondary | ICD-10-CM | POA: Diagnosis not present

## 2019-09-17 DIAGNOSIS — M25511 Pain in right shoulder: Secondary | ICD-10-CM | POA: Diagnosis not present

## 2019-09-17 DIAGNOSIS — Z471 Aftercare following joint replacement surgery: Secondary | ICD-10-CM | POA: Diagnosis not present

## 2019-09-26 DIAGNOSIS — Z471 Aftercare following joint replacement surgery: Secondary | ICD-10-CM | POA: Diagnosis not present

## 2019-09-26 DIAGNOSIS — M25511 Pain in right shoulder: Secondary | ICD-10-CM | POA: Diagnosis not present

## 2019-10-03 DIAGNOSIS — Z471 Aftercare following joint replacement surgery: Secondary | ICD-10-CM | POA: Diagnosis not present

## 2019-10-03 DIAGNOSIS — M25511 Pain in right shoulder: Secondary | ICD-10-CM | POA: Diagnosis not present

## 2019-10-10 DIAGNOSIS — Z471 Aftercare following joint replacement surgery: Secondary | ICD-10-CM | POA: Diagnosis not present

## 2019-10-10 DIAGNOSIS — M25511 Pain in right shoulder: Secondary | ICD-10-CM | POA: Diagnosis not present

## 2019-10-17 DIAGNOSIS — M25511 Pain in right shoulder: Secondary | ICD-10-CM | POA: Diagnosis not present

## 2019-10-17 DIAGNOSIS — Z471 Aftercare following joint replacement surgery: Secondary | ICD-10-CM | POA: Diagnosis not present

## 2019-10-31 DIAGNOSIS — M19011 Primary osteoarthritis, right shoulder: Secondary | ICD-10-CM | POA: Diagnosis not present

## 2020-01-06 DIAGNOSIS — G629 Polyneuropathy, unspecified: Secondary | ICD-10-CM | POA: Diagnosis not present

## 2020-01-06 DIAGNOSIS — D234 Other benign neoplasm of skin of scalp and neck: Secondary | ICD-10-CM | POA: Diagnosis not present

## 2020-01-16 DIAGNOSIS — L7211 Pilar cyst: Secondary | ICD-10-CM | POA: Diagnosis not present

## 2020-01-16 DIAGNOSIS — L821 Other seborrheic keratosis: Secondary | ICD-10-CM | POA: Diagnosis not present

## 2020-06-22 DIAGNOSIS — G629 Polyneuropathy, unspecified: Secondary | ICD-10-CM | POA: Diagnosis not present

## 2020-06-25 DIAGNOSIS — R42 Dizziness and giddiness: Secondary | ICD-10-CM | POA: Diagnosis not present

## 2020-06-25 DIAGNOSIS — R200 Anesthesia of skin: Secondary | ICD-10-CM | POA: Diagnosis not present

## 2020-08-11 DIAGNOSIS — G629 Polyneuropathy, unspecified: Secondary | ICD-10-CM | POA: Diagnosis not present

## 2020-08-12 DIAGNOSIS — G629 Polyneuropathy, unspecified: Secondary | ICD-10-CM | POA: Diagnosis not present

## 2020-09-08 DIAGNOSIS — E785 Hyperlipidemia, unspecified: Secondary | ICD-10-CM | POA: Diagnosis not present

## 2020-09-08 DIAGNOSIS — I10 Essential (primary) hypertension: Secondary | ICD-10-CM | POA: Diagnosis not present

## 2020-09-08 DIAGNOSIS — Z0000 Encounter for general adult medical examination without abnormal findings: Secondary | ICD-10-CM | POA: Diagnosis not present

## 2020-10-20 DIAGNOSIS — G629 Polyneuropathy, unspecified: Secondary | ICD-10-CM | POA: Diagnosis not present

## 2020-12-08 DIAGNOSIS — N390 Urinary tract infection, site not specified: Secondary | ICD-10-CM | POA: Diagnosis not present

## 2020-12-08 DIAGNOSIS — R300 Dysuria: Secondary | ICD-10-CM | POA: Diagnosis not present

## 2020-12-21 DIAGNOSIS — G629 Polyneuropathy, unspecified: Secondary | ICD-10-CM | POA: Diagnosis not present

## 2021-01-12 DIAGNOSIS — Z23 Encounter for immunization: Secondary | ICD-10-CM | POA: Diagnosis not present

## 2021-01-21 DIAGNOSIS — R930 Abnormal findings on diagnostic imaging of skull and head, not elsewhere classified: Secondary | ICD-10-CM | POA: Diagnosis not present

## 2021-01-21 DIAGNOSIS — R42 Dizziness and giddiness: Secondary | ICD-10-CM | POA: Diagnosis not present

## 2021-01-21 DIAGNOSIS — R9082 White matter disease, unspecified: Secondary | ICD-10-CM | POA: Diagnosis not present

## 2021-03-25 DIAGNOSIS — R29898 Other symptoms and signs involving the musculoskeletal system: Secondary | ICD-10-CM | POA: Diagnosis not present

## 2021-04-12 DIAGNOSIS — M5136 Other intervertebral disc degeneration, lumbar region: Secondary | ICD-10-CM | POA: Diagnosis not present

## 2021-04-12 DIAGNOSIS — Z9889 Other specified postprocedural states: Secondary | ICD-10-CM | POA: Diagnosis not present

## 2021-04-12 DIAGNOSIS — M5135 Other intervertebral disc degeneration, thoracolumbar region: Secondary | ICD-10-CM | POA: Diagnosis not present

## 2021-04-12 DIAGNOSIS — M48061 Spinal stenosis, lumbar region without neurogenic claudication: Secondary | ICD-10-CM | POA: Diagnosis not present

## 2021-04-12 DIAGNOSIS — M47816 Spondylosis without myelopathy or radiculopathy, lumbar region: Secondary | ICD-10-CM | POA: Diagnosis not present

## 2021-04-12 DIAGNOSIS — M4807 Spinal stenosis, lumbosacral region: Secondary | ICD-10-CM | POA: Diagnosis not present

## 2021-04-12 DIAGNOSIS — M5137 Other intervertebral disc degeneration, lumbosacral region: Secondary | ICD-10-CM | POA: Diagnosis not present

## 2021-04-21 DIAGNOSIS — M549 Dorsalgia, unspecified: Secondary | ICD-10-CM | POA: Diagnosis not present

## 2021-04-29 DIAGNOSIS — M1990 Unspecified osteoarthritis, unspecified site: Secondary | ICD-10-CM | POA: Diagnosis not present

## 2021-04-29 DIAGNOSIS — G629 Polyneuropathy, unspecified: Secondary | ICD-10-CM | POA: Diagnosis not present

## 2021-05-25 DIAGNOSIS — M48061 Spinal stenosis, lumbar region without neurogenic claudication: Secondary | ICD-10-CM | POA: Diagnosis not present

## 2021-05-25 DIAGNOSIS — M5459 Other low back pain: Secondary | ICD-10-CM | POA: Diagnosis not present

## 2021-05-25 DIAGNOSIS — M5136 Other intervertebral disc degeneration, lumbar region: Secondary | ICD-10-CM | POA: Diagnosis not present

## 2021-05-25 DIAGNOSIS — M47816 Spondylosis without myelopathy or radiculopathy, lumbar region: Secondary | ICD-10-CM | POA: Diagnosis not present

## 2021-05-25 DIAGNOSIS — M7918 Myalgia, other site: Secondary | ICD-10-CM | POA: Diagnosis not present

## 2021-05-25 DIAGNOSIS — G8929 Other chronic pain: Secondary | ICD-10-CM | POA: Diagnosis not present

## 2021-05-31 DIAGNOSIS — M5459 Other low back pain: Secondary | ICD-10-CM | POA: Diagnosis not present

## 2021-05-31 DIAGNOSIS — M47816 Spondylosis without myelopathy or radiculopathy, lumbar region: Secondary | ICD-10-CM | POA: Diagnosis not present

## 2021-07-02 DIAGNOSIS — K649 Unspecified hemorrhoids: Secondary | ICD-10-CM | POA: Diagnosis not present

## 2021-07-12 DIAGNOSIS — M47816 Spondylosis without myelopathy or radiculopathy, lumbar region: Secondary | ICD-10-CM | POA: Diagnosis not present

## 2021-07-21 DIAGNOSIS — M48061 Spinal stenosis, lumbar region without neurogenic claudication: Secondary | ICD-10-CM | POA: Diagnosis not present

## 2021-07-21 DIAGNOSIS — M5459 Other low back pain: Secondary | ICD-10-CM | POA: Diagnosis not present

## 2021-07-21 DIAGNOSIS — M5136 Other intervertebral disc degeneration, lumbar region: Secondary | ICD-10-CM | POA: Diagnosis not present

## 2021-07-21 DIAGNOSIS — M7918 Myalgia, other site: Secondary | ICD-10-CM | POA: Diagnosis not present

## 2021-07-21 DIAGNOSIS — M47816 Spondylosis without myelopathy or radiculopathy, lumbar region: Secondary | ICD-10-CM | POA: Diagnosis not present

## 2021-07-21 DIAGNOSIS — G8929 Other chronic pain: Secondary | ICD-10-CM | POA: Diagnosis not present

## 2021-07-28 DIAGNOSIS — M549 Dorsalgia, unspecified: Secondary | ICD-10-CM | POA: Diagnosis not present

## 2021-08-04 DIAGNOSIS — Z135 Encounter for screening for eye and ear disorders: Secondary | ICD-10-CM | POA: Diagnosis not present

## 2021-08-04 DIAGNOSIS — H35371 Puckering of macula, right eye: Secondary | ICD-10-CM | POA: Diagnosis not present

## 2021-08-04 DIAGNOSIS — H5203 Hypermetropia, bilateral: Secondary | ICD-10-CM | POA: Diagnosis not present

## 2021-08-04 DIAGNOSIS — H43812 Vitreous degeneration, left eye: Secondary | ICD-10-CM | POA: Diagnosis not present

## 2021-08-04 DIAGNOSIS — H353120 Nonexudative age-related macular degeneration, left eye, stage unspecified: Secondary | ICD-10-CM | POA: Diagnosis not present

## 2021-09-06 DIAGNOSIS — M5459 Other low back pain: Secondary | ICD-10-CM | POA: Diagnosis not present

## 2021-09-06 DIAGNOSIS — M47816 Spondylosis without myelopathy or radiculopathy, lumbar region: Secondary | ICD-10-CM | POA: Diagnosis not present

## 2021-09-13 DIAGNOSIS — I10 Essential (primary) hypertension: Secondary | ICD-10-CM | POA: Diagnosis not present

## 2021-09-13 DIAGNOSIS — Z0000 Encounter for general adult medical examination without abnormal findings: Secondary | ICD-10-CM | POA: Diagnosis not present

## 2021-09-13 DIAGNOSIS — Z23 Encounter for immunization: Secondary | ICD-10-CM | POA: Diagnosis not present

## 2021-09-13 DIAGNOSIS — E785 Hyperlipidemia, unspecified: Secondary | ICD-10-CM | POA: Diagnosis not present

## 2021-10-18 DIAGNOSIS — M48061 Spinal stenosis, lumbar region without neurogenic claudication: Secondary | ICD-10-CM | POA: Diagnosis not present

## 2021-10-18 DIAGNOSIS — M5136 Other intervertebral disc degeneration, lumbar region: Secondary | ICD-10-CM | POA: Diagnosis not present

## 2021-10-18 DIAGNOSIS — M5459 Other low back pain: Secondary | ICD-10-CM | POA: Diagnosis not present

## 2021-10-18 DIAGNOSIS — M7918 Myalgia, other site: Secondary | ICD-10-CM | POA: Diagnosis not present

## 2021-10-18 DIAGNOSIS — G8929 Other chronic pain: Secondary | ICD-10-CM | POA: Diagnosis not present

## 2021-10-18 DIAGNOSIS — M47816 Spondylosis without myelopathy or radiculopathy, lumbar region: Secondary | ICD-10-CM | POA: Diagnosis not present

## 2021-11-24 DIAGNOSIS — H35371 Puckering of macula, right eye: Secondary | ICD-10-CM | POA: Diagnosis not present

## 2021-11-24 DIAGNOSIS — H353122 Nonexudative age-related macular degeneration, left eye, intermediate dry stage: Secondary | ICD-10-CM | POA: Diagnosis not present

## 2021-12-10 IMAGING — DX DG SHOULDER 2+V PORT*R*
1 series · 1 of 1 positions shown · non-contrast
Comparison: 07/12/2019 right shoulder CT

CLINICAL DATA: Status post right total shoulder arthroplasty

EXAM:
PORTABLE RIGHT SHOULDER

[shoulder ap]
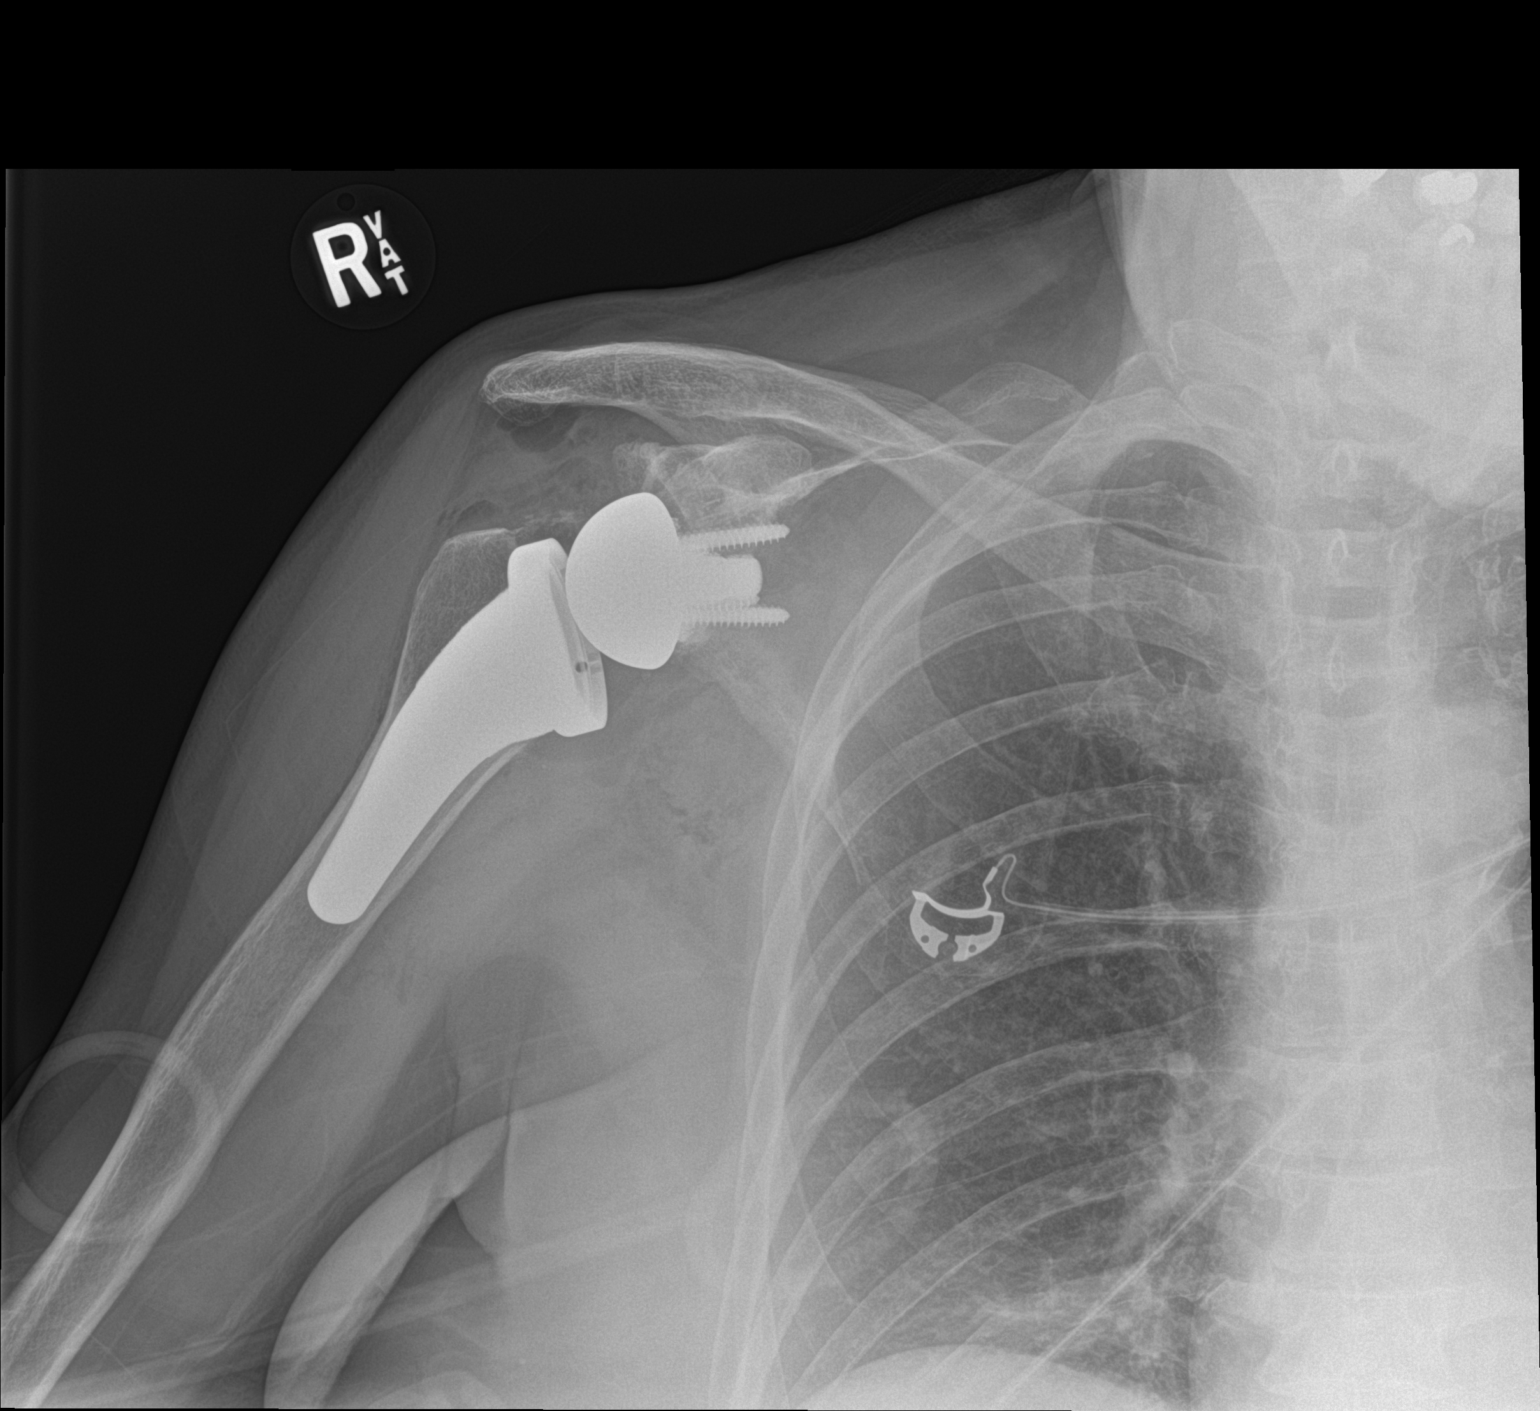

[1 of 1 positions shown; findings below may reference images not displayed]

FINDINGS: Interval right total shoulder arthroplasty with well-positioned
right glenoid and right proximal humerus prostheses with no evidence
of glenohumeral dislocation on this single frontal view. No osseous
fractures. No focal osseous lesions. Expected soft tissue gas
surrounding the right shoulder joint.
IMPRESSION: Satisfactory frontal view immediate postoperative appearance status
post right total shoulder arthroplasty.

## 2021-12-17 DIAGNOSIS — M48061 Spinal stenosis, lumbar region without neurogenic claudication: Secondary | ICD-10-CM | POA: Diagnosis not present

## 2021-12-17 DIAGNOSIS — M5136 Other intervertebral disc degeneration, lumbar region: Secondary | ICD-10-CM | POA: Diagnosis not present

## 2021-12-28 DIAGNOSIS — M549 Dorsalgia, unspecified: Secondary | ICD-10-CM | POA: Diagnosis not present

## 2022-03-21 DIAGNOSIS — Z9689 Presence of other specified functional implants: Secondary | ICD-10-CM

## 2022-03-21 HISTORY — DX: Presence of other specified functional implants: Z96.89

## 2022-07-15 DIAGNOSIS — Z1330 Encounter for screening examination for mental health and behavioral disorders, unspecified: Secondary | ICD-10-CM | POA: Diagnosis not present

## 2022-07-15 DIAGNOSIS — R42 Dizziness and giddiness: Secondary | ICD-10-CM | POA: Diagnosis not present

## 2022-07-28 DIAGNOSIS — M5459 Other low back pain: Secondary | ICD-10-CM | POA: Diagnosis not present

## 2022-07-28 DIAGNOSIS — M961 Postlaminectomy syndrome, not elsewhere classified: Secondary | ICD-10-CM | POA: Diagnosis not present

## 2022-07-28 DIAGNOSIS — M5136 Other intervertebral disc degeneration, lumbar region: Secondary | ICD-10-CM | POA: Diagnosis not present

## 2022-07-28 DIAGNOSIS — M47816 Spondylosis without myelopathy or radiculopathy, lumbar region: Secondary | ICD-10-CM | POA: Diagnosis not present

## 2022-07-28 DIAGNOSIS — G894 Chronic pain syndrome: Secondary | ICD-10-CM | POA: Diagnosis not present

## 2022-07-28 DIAGNOSIS — M48061 Spinal stenosis, lumbar region without neurogenic claudication: Secondary | ICD-10-CM | POA: Diagnosis not present

## 2022-08-08 DIAGNOSIS — F4542 Pain disorder with related psychological factors: Secondary | ICD-10-CM | POA: Diagnosis not present

## 2022-08-11 DIAGNOSIS — M5134 Other intervertebral disc degeneration, thoracic region: Secondary | ICD-10-CM | POA: Diagnosis not present

## 2022-08-11 DIAGNOSIS — Z01818 Encounter for other preprocedural examination: Secondary | ICD-10-CM | POA: Diagnosis not present

## 2022-08-27 DIAGNOSIS — G8911 Acute pain due to trauma: Secondary | ICD-10-CM | POA: Diagnosis not present

## 2022-08-27 DIAGNOSIS — M549 Dorsalgia, unspecified: Secondary | ICD-10-CM | POA: Diagnosis not present

## 2022-08-27 DIAGNOSIS — Z043 Encounter for examination and observation following other accident: Secondary | ICD-10-CM | POA: Diagnosis not present

## 2022-08-27 DIAGNOSIS — I10 Essential (primary) hypertension: Secondary | ICD-10-CM | POA: Diagnosis not present

## 2022-08-27 DIAGNOSIS — I6782 Cerebral ischemia: Secondary | ICD-10-CM | POA: Diagnosis not present

## 2022-08-27 DIAGNOSIS — R9082 White matter disease, unspecified: Secondary | ICD-10-CM | POA: Diagnosis not present

## 2022-08-27 DIAGNOSIS — Z87891 Personal history of nicotine dependence: Secondary | ICD-10-CM | POA: Diagnosis not present

## 2022-08-27 DIAGNOSIS — S90511A Abrasion, right ankle, initial encounter: Secondary | ICD-10-CM | POA: Diagnosis not present

## 2022-08-27 DIAGNOSIS — R479 Unspecified speech disturbances: Secondary | ICD-10-CM | POA: Diagnosis not present

## 2022-08-27 DIAGNOSIS — R296 Repeated falls: Secondary | ICD-10-CM | POA: Diagnosis not present

## 2022-08-27 DIAGNOSIS — S060X0A Concussion without loss of consciousness, initial encounter: Secondary | ICD-10-CM | POA: Diagnosis not present

## 2022-08-27 DIAGNOSIS — Z8673 Personal history of transient ischemic attack (TIA), and cerebral infarction without residual deficits: Secondary | ICD-10-CM | POA: Diagnosis not present

## 2022-08-27 DIAGNOSIS — Z23 Encounter for immunization: Secondary | ICD-10-CM | POA: Diagnosis not present

## 2022-08-27 DIAGNOSIS — G319 Degenerative disease of nervous system, unspecified: Secondary | ICD-10-CM | POA: Diagnosis not present

## 2022-09-01 DIAGNOSIS — Z01812 Encounter for preprocedural laboratory examination: Secondary | ICD-10-CM | POA: Diagnosis not present

## 2022-09-01 DIAGNOSIS — Z79899 Other long term (current) drug therapy: Secondary | ICD-10-CM | POA: Diagnosis not present

## 2022-09-02 DIAGNOSIS — I69398 Other sequelae of cerebral infarction: Secondary | ICD-10-CM | POA: Diagnosis not present

## 2022-09-02 DIAGNOSIS — M961 Postlaminectomy syndrome, not elsewhere classified: Secondary | ICD-10-CM | POA: Diagnosis not present

## 2022-09-02 DIAGNOSIS — M4726 Other spondylosis with radiculopathy, lumbar region: Secondary | ICD-10-CM | POA: Diagnosis not present

## 2022-09-02 DIAGNOSIS — M5116 Intervertebral disc disorders with radiculopathy, lumbar region: Secondary | ICD-10-CM | POA: Diagnosis not present

## 2022-09-02 DIAGNOSIS — I10 Essential (primary) hypertension: Secondary | ICD-10-CM | POA: Diagnosis not present

## 2022-09-02 DIAGNOSIS — M4727 Other spondylosis with radiculopathy, lumbosacral region: Secondary | ICD-10-CM | POA: Diagnosis not present

## 2022-09-02 DIAGNOSIS — H814 Vertigo of central origin: Secondary | ICD-10-CM | POA: Diagnosis not present

## 2022-09-02 DIAGNOSIS — E785 Hyperlipidemia, unspecified: Secondary | ICD-10-CM | POA: Diagnosis not present

## 2022-09-02 DIAGNOSIS — G894 Chronic pain syndrome: Secondary | ICD-10-CM | POA: Diagnosis not present

## 2022-09-02 DIAGNOSIS — M5136 Other intervertebral disc degeneration, lumbar region: Secondary | ICD-10-CM | POA: Diagnosis not present

## 2022-09-02 DIAGNOSIS — M48061 Spinal stenosis, lumbar region without neurogenic claudication: Secondary | ICD-10-CM | POA: Diagnosis not present

## 2022-09-07 DIAGNOSIS — M47816 Spondylosis without myelopathy or radiculopathy, lumbar region: Secondary | ICD-10-CM | POA: Diagnosis not present

## 2022-09-07 DIAGNOSIS — Z9689 Presence of other specified functional implants: Secondary | ICD-10-CM | POA: Diagnosis not present

## 2022-09-07 DIAGNOSIS — S22060A Wedge compression fracture of T7-T8 vertebra, initial encounter for closed fracture: Secondary | ICD-10-CM | POA: Diagnosis not present

## 2022-09-07 DIAGNOSIS — M5136 Other intervertebral disc degeneration, lumbar region: Secondary | ICD-10-CM | POA: Diagnosis not present

## 2022-09-08 DIAGNOSIS — M961 Postlaminectomy syndrome, not elsewhere classified: Secondary | ICD-10-CM | POA: Diagnosis not present

## 2022-09-08 DIAGNOSIS — S22060A Wedge compression fracture of T7-T8 vertebra, initial encounter for closed fracture: Secondary | ICD-10-CM | POA: Diagnosis not present

## 2022-09-08 DIAGNOSIS — Z9689 Presence of other specified functional implants: Secondary | ICD-10-CM | POA: Diagnosis not present

## 2022-09-08 DIAGNOSIS — M5459 Other low back pain: Secondary | ICD-10-CM | POA: Diagnosis not present

## 2022-09-08 DIAGNOSIS — M5136 Other intervertebral disc degeneration, lumbar region: Secondary | ICD-10-CM | POA: Diagnosis not present

## 2022-09-08 DIAGNOSIS — M48061 Spinal stenosis, lumbar region without neurogenic claudication: Secondary | ICD-10-CM | POA: Diagnosis not present

## 2022-09-08 DIAGNOSIS — G894 Chronic pain syndrome: Secondary | ICD-10-CM | POA: Diagnosis not present

## 2022-09-08 DIAGNOSIS — M47816 Spondylosis without myelopathy or radiculopathy, lumbar region: Secondary | ICD-10-CM | POA: Diagnosis not present

## 2022-10-28 DIAGNOSIS — N1831 Chronic kidney disease, stage 3a: Secondary | ICD-10-CM | POA: Diagnosis not present

## 2022-10-28 DIAGNOSIS — Z780 Asymptomatic menopausal state: Secondary | ICD-10-CM | POA: Diagnosis not present

## 2022-10-28 DIAGNOSIS — Z9181 History of falling: Secondary | ICD-10-CM | POA: Diagnosis not present

## 2022-10-28 DIAGNOSIS — E785 Hyperlipidemia, unspecified: Secondary | ICD-10-CM | POA: Diagnosis not present

## 2022-10-28 DIAGNOSIS — I10 Essential (primary) hypertension: Secondary | ICD-10-CM | POA: Diagnosis not present

## 2022-10-28 DIAGNOSIS — Z0000 Encounter for general adult medical examination without abnormal findings: Secondary | ICD-10-CM | POA: Diagnosis not present

## 2022-10-28 DIAGNOSIS — D234 Other benign neoplasm of skin of scalp and neck: Secondary | ICD-10-CM | POA: Diagnosis not present

## 2022-11-01 ENCOUNTER — Other Ambulatory Visit: Payer: Self-pay | Admitting: Family Medicine

## 2022-11-01 DIAGNOSIS — E2839 Other primary ovarian failure: Secondary | ICD-10-CM

## 2022-11-01 DIAGNOSIS — Z780 Asymptomatic menopausal state: Secondary | ICD-10-CM

## 2022-11-11 DIAGNOSIS — G894 Chronic pain syndrome: Secondary | ICD-10-CM | POA: Diagnosis not present

## 2022-11-11 DIAGNOSIS — M5410 Radiculopathy, site unspecified: Secondary | ICD-10-CM | POA: Diagnosis not present

## 2022-11-11 DIAGNOSIS — Z8673 Personal history of transient ischemic attack (TIA), and cerebral infarction without residual deficits: Secondary | ICD-10-CM | POA: Diagnosis not present

## 2022-11-11 DIAGNOSIS — Z87311 Personal history of (healed) other pathological fracture: Secondary | ICD-10-CM | POA: Diagnosis not present

## 2022-11-11 DIAGNOSIS — M48062 Spinal stenosis, lumbar region with neurogenic claudication: Secondary | ICD-10-CM | POA: Diagnosis not present

## 2022-11-11 DIAGNOSIS — M545 Low back pain, unspecified: Secondary | ICD-10-CM | POA: Diagnosis not present

## 2022-11-11 DIAGNOSIS — I10 Essential (primary) hypertension: Secondary | ICD-10-CM | POA: Diagnosis not present

## 2022-11-11 DIAGNOSIS — E785 Hyperlipidemia, unspecified: Secondary | ICD-10-CM | POA: Diagnosis not present

## 2022-11-11 DIAGNOSIS — R011 Cardiac murmur, unspecified: Secondary | ICD-10-CM | POA: Diagnosis not present

## 2022-11-11 DIAGNOSIS — M5136 Other intervertebral disc degeneration, lumbar region: Secondary | ICD-10-CM | POA: Diagnosis not present

## 2022-11-11 DIAGNOSIS — M4726 Other spondylosis with radiculopathy, lumbar region: Secondary | ICD-10-CM | POA: Diagnosis not present

## 2022-11-11 DIAGNOSIS — M48061 Spinal stenosis, lumbar region without neurogenic claudication: Secondary | ICD-10-CM | POA: Diagnosis not present

## 2022-11-11 DIAGNOSIS — G8929 Other chronic pain: Secondary | ICD-10-CM | POA: Diagnosis not present

## 2022-11-11 DIAGNOSIS — M961 Postlaminectomy syndrome, not elsewhere classified: Secondary | ICD-10-CM | POA: Diagnosis not present

## 2022-11-22 DIAGNOSIS — M5459 Other low back pain: Secondary | ICD-10-CM | POA: Diagnosis not present

## 2022-11-22 DIAGNOSIS — M5136 Other intervertebral disc degeneration, lumbar region: Secondary | ICD-10-CM | POA: Diagnosis not present

## 2022-11-22 DIAGNOSIS — M47816 Spondylosis without myelopathy or radiculopathy, lumbar region: Secondary | ICD-10-CM | POA: Diagnosis not present

## 2022-11-22 DIAGNOSIS — Z09 Encounter for follow-up examination after completed treatment for conditions other than malignant neoplasm: Secondary | ICD-10-CM | POA: Diagnosis not present

## 2022-11-22 DIAGNOSIS — M961 Postlaminectomy syndrome, not elsewhere classified: Secondary | ICD-10-CM | POA: Diagnosis not present

## 2022-11-22 DIAGNOSIS — Z8673 Personal history of transient ischemic attack (TIA), and cerebral infarction without residual deficits: Secondary | ICD-10-CM | POA: Diagnosis not present

## 2022-11-22 DIAGNOSIS — G894 Chronic pain syndrome: Secondary | ICD-10-CM | POA: Diagnosis not present

## 2022-11-22 DIAGNOSIS — Z9689 Presence of other specified functional implants: Secondary | ICD-10-CM | POA: Diagnosis not present

## 2022-11-22 DIAGNOSIS — M48061 Spinal stenosis, lumbar region without neurogenic claudication: Secondary | ICD-10-CM | POA: Diagnosis not present

## 2022-11-23 DIAGNOSIS — I10 Essential (primary) hypertension: Secondary | ICD-10-CM | POA: Diagnosis not present

## 2022-11-23 DIAGNOSIS — Z7982 Long term (current) use of aspirin: Secondary | ICD-10-CM | POA: Diagnosis not present

## 2022-11-23 DIAGNOSIS — R7989 Other specified abnormal findings of blood chemistry: Secondary | ICD-10-CM | POA: Diagnosis not present

## 2022-11-23 DIAGNOSIS — R040 Epistaxis: Secondary | ICD-10-CM | POA: Diagnosis not present

## 2022-11-23 DIAGNOSIS — Z87891 Personal history of nicotine dependence: Secondary | ICD-10-CM | POA: Diagnosis not present

## 2022-12-30 DIAGNOSIS — M47816 Spondylosis without myelopathy or radiculopathy, lumbar region: Secondary | ICD-10-CM | POA: Diagnosis not present

## 2022-12-30 DIAGNOSIS — Z9689 Presence of other specified functional implants: Secondary | ICD-10-CM | POA: Diagnosis not present

## 2022-12-30 DIAGNOSIS — M961 Postlaminectomy syndrome, not elsewhere classified: Secondary | ICD-10-CM | POA: Diagnosis not present

## 2022-12-30 DIAGNOSIS — M48061 Spinal stenosis, lumbar region without neurogenic claudication: Secondary | ICD-10-CM | POA: Diagnosis not present

## 2022-12-30 DIAGNOSIS — G894 Chronic pain syndrome: Secondary | ICD-10-CM | POA: Diagnosis not present

## 2022-12-30 DIAGNOSIS — M5459 Other low back pain: Secondary | ICD-10-CM | POA: Diagnosis not present

## 2023-03-02 ENCOUNTER — Other Ambulatory Visit: Payer: Self-pay | Admitting: Family Medicine

## 2023-03-02 DIAGNOSIS — R053 Chronic cough: Secondary | ICD-10-CM | POA: Diagnosis not present

## 2023-03-02 DIAGNOSIS — H6122 Impacted cerumen, left ear: Secondary | ICD-10-CM | POA: Diagnosis not present

## 2023-03-03 DIAGNOSIS — R053 Chronic cough: Secondary | ICD-10-CM | POA: Diagnosis not present

## 2023-03-03 DIAGNOSIS — J9811 Atelectasis: Secondary | ICD-10-CM | POA: Diagnosis not present

## 2023-03-17 DIAGNOSIS — F325 Major depressive disorder, single episode, in full remission: Secondary | ICD-10-CM | POA: Diagnosis not present

## 2023-03-17 DIAGNOSIS — M545 Low back pain, unspecified: Secondary | ICD-10-CM | POA: Diagnosis not present

## 2023-03-17 DIAGNOSIS — Z9181 History of falling: Secondary | ICD-10-CM | POA: Diagnosis not present

## 2023-03-17 DIAGNOSIS — Z791 Long term (current) use of non-steroidal anti-inflammatories (NSAID): Secondary | ICD-10-CM | POA: Diagnosis not present

## 2023-03-17 DIAGNOSIS — Z809 Family history of malignant neoplasm, unspecified: Secondary | ICD-10-CM | POA: Diagnosis not present

## 2023-03-17 DIAGNOSIS — E785 Hyperlipidemia, unspecified: Secondary | ICD-10-CM | POA: Diagnosis not present

## 2023-03-17 DIAGNOSIS — M1990 Unspecified osteoarthritis, unspecified site: Secondary | ICD-10-CM | POA: Diagnosis not present

## 2023-03-17 DIAGNOSIS — Z87891 Personal history of nicotine dependence: Secondary | ICD-10-CM | POA: Diagnosis not present

## 2023-03-17 DIAGNOSIS — Z8249 Family history of ischemic heart disease and other diseases of the circulatory system: Secondary | ICD-10-CM | POA: Diagnosis not present

## 2023-04-04 DIAGNOSIS — M5459 Other low back pain: Secondary | ICD-10-CM | POA: Diagnosis not present

## 2023-04-04 DIAGNOSIS — M47816 Spondylosis without myelopathy or radiculopathy, lumbar region: Secondary | ICD-10-CM | POA: Diagnosis not present

## 2023-04-04 DIAGNOSIS — M48061 Spinal stenosis, lumbar region without neurogenic claudication: Secondary | ICD-10-CM | POA: Diagnosis not present

## 2023-04-04 DIAGNOSIS — Z9689 Presence of other specified functional implants: Secondary | ICD-10-CM | POA: Diagnosis not present

## 2023-04-04 DIAGNOSIS — M961 Postlaminectomy syndrome, not elsewhere classified: Secondary | ICD-10-CM | POA: Diagnosis not present

## 2023-04-04 DIAGNOSIS — G894 Chronic pain syndrome: Secondary | ICD-10-CM | POA: Diagnosis not present

## 2023-04-18 DIAGNOSIS — I6381 Other cerebral infarction due to occlusion or stenosis of small artery: Secondary | ICD-10-CM | POA: Diagnosis not present

## 2023-05-01 DIAGNOSIS — L720 Epidermal cyst: Secondary | ICD-10-CM | POA: Diagnosis not present

## 2023-05-23 DIAGNOSIS — Z1330 Encounter for screening examination for mental health and behavioral disorders, unspecified: Secondary | ICD-10-CM | POA: Diagnosis not present

## 2023-05-23 DIAGNOSIS — Z8673 Personal history of transient ischemic attack (TIA), and cerebral infarction without residual deficits: Secondary | ICD-10-CM | POA: Diagnosis not present

## 2023-07-03 DIAGNOSIS — G894 Chronic pain syndrome: Secondary | ICD-10-CM | POA: Diagnosis not present

## 2023-07-03 DIAGNOSIS — M48061 Spinal stenosis, lumbar region without neurogenic claudication: Secondary | ICD-10-CM | POA: Diagnosis not present

## 2023-07-03 DIAGNOSIS — M5459 Other low back pain: Secondary | ICD-10-CM | POA: Diagnosis not present

## 2023-07-03 DIAGNOSIS — M961 Postlaminectomy syndrome, not elsewhere classified: Secondary | ICD-10-CM | POA: Diagnosis not present

## 2023-07-03 DIAGNOSIS — M47816 Spondylosis without myelopathy or radiculopathy, lumbar region: Secondary | ICD-10-CM | POA: Diagnosis not present

## 2023-07-03 DIAGNOSIS — Z9689 Presence of other specified functional implants: Secondary | ICD-10-CM | POA: Diagnosis not present

## 2023-10-26 DIAGNOSIS — E538 Deficiency of other specified B group vitamins: Secondary | ICD-10-CM | POA: Diagnosis not present

## 2023-10-26 DIAGNOSIS — Z6829 Body mass index (BMI) 29.0-29.9, adult: Secondary | ICD-10-CM | POA: Diagnosis not present

## 2023-10-26 DIAGNOSIS — Z780 Asymptomatic menopausal state: Secondary | ICD-10-CM | POA: Diagnosis not present

## 2023-10-26 DIAGNOSIS — I10 Essential (primary) hypertension: Secondary | ICD-10-CM | POA: Diagnosis not present

## 2023-10-26 DIAGNOSIS — M1990 Unspecified osteoarthritis, unspecified site: Secondary | ICD-10-CM | POA: Diagnosis not present

## 2023-10-26 DIAGNOSIS — E785 Hyperlipidemia, unspecified: Secondary | ICD-10-CM | POA: Diagnosis not present

## 2023-10-26 DIAGNOSIS — R42 Dizziness and giddiness: Secondary | ICD-10-CM | POA: Diagnosis not present

## 2023-10-26 DIAGNOSIS — N1831 Chronic kidney disease, stage 3a: Secondary | ICD-10-CM | POA: Diagnosis not present

## 2023-10-26 DIAGNOSIS — Z0000 Encounter for general adult medical examination without abnormal findings: Secondary | ICD-10-CM | POA: Diagnosis not present

## 2023-10-26 DIAGNOSIS — E559 Vitamin D deficiency, unspecified: Secondary | ICD-10-CM | POA: Diagnosis not present

## 2024-02-02 ENCOUNTER — Emergency Department (HOSPITAL_COMMUNITY)

## 2024-02-02 ENCOUNTER — Encounter (HOSPITAL_COMMUNITY): Payer: Self-pay

## 2024-02-02 ENCOUNTER — Observation Stay (HOSPITAL_COMMUNITY)
Admission: EM | Admit: 2024-02-02 | Discharge: 2024-02-07 | Disposition: A | Discharge status: Home Health | Attending: Internal Medicine | Admitting: Internal Medicine

## 2024-02-02 ENCOUNTER — Other Ambulatory Visit: Payer: Self-pay

## 2024-02-02 DIAGNOSIS — S82451A Displaced comminuted fracture of shaft of right fibula, initial encounter for closed fracture: Secondary | ICD-10-CM | POA: Diagnosis not present

## 2024-02-02 DIAGNOSIS — E785 Hyperlipidemia, unspecified: Secondary | ICD-10-CM | POA: Diagnosis present

## 2024-02-02 DIAGNOSIS — K5900 Constipation, unspecified: Secondary | ICD-10-CM | POA: Insufficient documentation

## 2024-02-02 DIAGNOSIS — S82851D Displaced trimalleolar fracture of right lower leg, subsequent encounter for closed fracture with routine healing: Secondary | ICD-10-CM | POA: Diagnosis not present

## 2024-02-02 DIAGNOSIS — Z87891 Personal history of nicotine dependence: Secondary | ICD-10-CM | POA: Diagnosis not present

## 2024-02-02 DIAGNOSIS — W1830XA Fall on same level, unspecified, initial encounter: Secondary | ICD-10-CM | POA: Insufficient documentation

## 2024-02-02 DIAGNOSIS — R2681 Unsteadiness on feet: Secondary | ICD-10-CM | POA: Diagnosis not present

## 2024-02-02 DIAGNOSIS — R739 Hyperglycemia, unspecified: Secondary | ICD-10-CM | POA: Diagnosis present

## 2024-02-02 DIAGNOSIS — S8251XA Displaced fracture of medial malleolus of right tibia, initial encounter for closed fracture: Secondary | ICD-10-CM | POA: Diagnosis not present

## 2024-02-02 DIAGNOSIS — I129 Hypertensive chronic kidney disease with stage 1 through stage 4 chronic kidney disease, or unspecified chronic kidney disease: Secondary | ICD-10-CM | POA: Insufficient documentation

## 2024-02-02 DIAGNOSIS — S82401A Unspecified fracture of shaft of right fibula, initial encounter for closed fracture: Secondary | ICD-10-CM | POA: Diagnosis not present

## 2024-02-02 DIAGNOSIS — D649 Anemia, unspecified: Secondary | ICD-10-CM | POA: Diagnosis not present

## 2024-02-02 DIAGNOSIS — Z7982 Long term (current) use of aspirin: Secondary | ICD-10-CM | POA: Diagnosis not present

## 2024-02-02 DIAGNOSIS — S82851A Displaced trimalleolar fracture of right lower leg, initial encounter for closed fracture: Secondary | ICD-10-CM | POA: Diagnosis not present

## 2024-02-02 DIAGNOSIS — S82891A Other fracture of right lower leg, initial encounter for closed fracture: Secondary | ICD-10-CM | POA: Diagnosis present

## 2024-02-02 DIAGNOSIS — N1831 Chronic kidney disease, stage 3a: Secondary | ICD-10-CM | POA: Diagnosis present

## 2024-02-02 DIAGNOSIS — R600 Localized edema: Secondary | ICD-10-CM | POA: Diagnosis not present

## 2024-02-02 DIAGNOSIS — S99921A Unspecified injury of right foot, initial encounter: Secondary | ICD-10-CM | POA: Diagnosis present

## 2024-02-02 DIAGNOSIS — J9811 Atelectasis: Secondary | ICD-10-CM | POA: Diagnosis not present

## 2024-02-02 DIAGNOSIS — I10 Essential (primary) hypertension: Secondary | ICD-10-CM | POA: Diagnosis present

## 2024-02-02 DIAGNOSIS — E559 Vitamin D deficiency, unspecified: Secondary | ICD-10-CM | POA: Diagnosis present

## 2024-02-02 DIAGNOSIS — R000 Tachycardia, unspecified: Secondary | ICD-10-CM | POA: Diagnosis not present

## 2024-02-02 MED ORDER — PROPOFOL 10 MG/ML IV BOLUS
0.5000 mg/kg | Freq: Once | INTRAVENOUS | Status: AC
  Administered 2024-02-02: 20 mg via INTRAVENOUS
  Filled 2024-02-02: qty 20

## 2024-02-02 MED ORDER — ONDANSETRON HCL 4 MG/2ML IJ SOLN
INTRAMUSCULAR | Status: AC
  Administered 2024-02-02: 4 mg via INTRAVENOUS
  Filled 2024-02-02: qty 2

## 2024-02-02 MED ORDER — PROPOFOL 10 MG/ML IV BOLUS
0.5000 mg/kg | Freq: Once | INTRAVENOUS | Status: AC
  Administered 2024-02-02: 40.8 mg via INTRAVENOUS
  Filled 2024-02-02: qty 20

## 2024-02-02 MED ORDER — MORPHINE SULFATE (PF) 2 MG/ML IV SOLN
2.0000 mg | Freq: Once | INTRAVENOUS | Status: AC
  Administered 2024-02-02: 2 mg via INTRAVENOUS
  Filled 2024-02-02: qty 1

## 2024-02-02 MED ORDER — MORPHINE SULFATE (PF) 2 MG/ML IV SOLN
INTRAVENOUS | Status: AC
  Administered 2024-02-02: 2 mg via INTRAVENOUS
  Filled 2024-02-02: qty 1

## 2024-02-02 NOTE — ED Provider Notes (Signed)
 SABRASplint Application  Date/Time: 02/02/2024 11:06 PM  Performed by: Neysa Caron PARAS, DO Authorized by: Neysa Caron PARAS, DO   Consent:    Consent obtained:  Verbal   Consent given by:  Patient   Risks discussed:  Discoloration, numbness, pain and swelling   Alternatives discussed:  No treatment Universal protocol:    Procedure explained and questions answered to patient or proxy's satisfaction: yes     Patient identity confirmed:  Verbally with patient Pre-procedure details:    Pre-procedure CMS: decreased color and pulse. Procedure details:    Location:  Ankle   Ankle location:  R ankle   Lower extremity cast type: L and U.   Supplies:  Cotton padding and fiberglass   Attestation: Splint applied and adjusted personally by me   Post-procedure details:    Distal perfusion: brisk capillary refill     Procedure completion:  Tolerated   Post-procedure imaging: reviewed   .Sedation  Date/Time: 02/02/2024 11:07 PM  Performed by: Neysa Caron PARAS, DO Authorized by: Neysa Caron PARAS, DO   Consent:    Consent obtained:  Verbal   Consent given by:  Patient   Risks discussed:  Allergic reaction, dysrhythmia, prolonged hypoxia resulting in organ damage, prolonged sedation necessitating reversal, vomiting, respiratory compromise necessitating ventilatory assistance and intubation, inadequate sedation and nausea Universal protocol:    Procedure explained and questions answered to patient or proxy's satisfaction: yes     Immediately prior to procedure, a time out was called: yes     Patient identity confirmed:  Arm band and verbally with patient Indications:    Procedure performed:  Fracture reduction   Procedure necessitating sedation performed by:  Physician performing sedation Pre-sedation assessment:    Time since last food or drink:  6   ASA classification: class 3 - patient with severe systemic disease     Mouth opening:  3 or more finger widths   Thyromental distance:  2 finger  widths   Mallampati score:  II - soft palate, uvula, fauces visible   Neck mobility: normal     Pre-sedation assessments completed and reviewed: airway patency, cardiovascular function, hydration status, mental status, nausea/vomiting, pain level, respiratory function and temperature   A pre-sedation assessment was completed prior to the start of the procedure Immediate pre-procedure details:    Reassessment: Patient reassessed immediately prior to procedure     Reviewed: vital signs     Verified: bag valve mask available, emergency equipment available, intubation equipment available, IV patency confirmed and oxygen available   Procedure details (see MAR for exact dosages):    Preoxygenation:  Nasal cannula   Sedation:  Propofol    Intended level of sedation: deep   Intra-procedure monitoring:  Blood pressure monitoring, cardiac monitor, continuous capnometry, continuous pulse oximetry, frequent LOC assessments and frequent vital sign checks   Intra-procedure events: respiratory depression     Intra-procedure management:  Airway repositioning and BVM ventilation   Total Provider sedation time (minutes):  10 Post-procedure details:   A post-sedation assessment was completed following the completion of the procedure.   Attendance: Constant attendance by certified staff until patient recovered     Recovery: Patient returned to pre-procedure baseline     Patient is stable for discharge or admission: yes     Procedure completion:  Tolerated well, no immediate complications Comments:     Patient with 30 seconds of apnea desaturated down to 89%.  Briefly bagged with BVM with immediate improvement of oxygen saturation.  Resumed spontaneous  respiration and no further episodes of apnea/hypoxia     Neysa Caron PARAS, DO 02/02/24 2310

## 2024-02-02 NOTE — ED Provider Notes (Signed)
 [place] EMERGENCY DEPARTMENT AT [facility] Provider Note   CSN: [id number] Arrival date & time: 02/02/24  8175     Patient presents with: Foot Injury   Mary Adkins is a 83 y.o. female presents today for right foot/ankle pain after she missed a step while stepping off a golf cart and fell.  Patient denies head injury, LOC, numbness, weakness, or any other injuries at this time.  Patient denies blood thinner use.    Foot Injury      Prior to Admission medications   Medication Sig Start Date End Date Taking? Authorizing Provider  amitriptyline (ELAVIL) 10 MG tablet Take 30 mg by mouth at bedtime.   Yes [provider]  aspirin  EC (EQ ASPIRIN  ADULT LOW DOSE) 81 MG tablet Take 81 mg by mouth in the morning.   Yes [provider]  calcium carbonate (SUPER CALCIUM) 1500 (600 Ca) MG TABS tablet Take 1,200 mg by mouth daily with breakfast.   Yes [provider]  cholecalciferol  (VITAMIN D) 1000 UNITS tablet Take 1,000 Units by mouth daily.   Yes [provider]  diclofenac  (VOLTAREN ) 75 MG EC tablet Take 75 mg by mouth 2 (two) times daily. 09/09/14  Yes [provider]  ezetimibe (ZETIA) 10 MG tablet Take 10 mg by mouth daily. 06/23/19  Yes [provider]  nicotine  polacrilex (NICORETTE ) 2 MG gum Take 2 mg by mouth as needed for smoking cessation.    Yes [provider]  vitamin B-12 (CYANOCOBALAMIN ) 500 MCG tablet Take 250 mcg by mouth daily.    Yes [provider]  cetirizine (ZYRTEC) 10 MG tablet Take 10 mg by mouth daily at 2 PM. Patient not taking: Reported on 02/02/2024    [provider]    Allergies: Acetazolamide, Amlodipine , Atorvastatin, Penicillins, Prednisone, Rosuvastatin, Codeine, and Diazepam    Review of Systems  Musculoskeletal:  Positive for arthralgias.    Updated Vital Signs BP 138/87 (BP Location: Right Arm)   Pulse 98   Temp (!) 97.5 F (36.4 C) (Oral)    Resp 17   Ht 5' 6 (1.676 m)   Wt 81.6 kg   SpO2 100%   BMI 29.05 kg/m   Physical Exam Vitals and nursing note reviewed.  Constitutional:      General: She is not in acute distress.    Appearance: She is well-developed. She is not toxic-appearing.  HENT:     Head: Normocephalic and atraumatic.  Eyes:     Conjunctiva/sclera: Conjunctivae normal.  Cardiovascular:     Rate and Rhythm: Normal rate and regular rhythm.     Pulses: Normal pulses.     Heart sounds: Normal heart sounds. No murmur heard. Pulmonary:     Effort: Pulmonary effort is normal. No respiratory distress.     Breath sounds: Normal breath sounds.  Abdominal:     Palpations: Abdomen is soft.     Tenderness: There is no abdominal tenderness.  Musculoskeletal:        General: Swelling, tenderness, deformity and signs of injury present.     Cervical back: Neck supple.     Comments: Patient with obvious deformity of her right ankle with moderate swelling and ecchymosis about the medial malleolus.  Patient is neurovascularly intact, however right dorsalis pedis pulse is weaker than left.  Skin:    General: Skin is warm and dry.     Capillary Refill: Capillary refill takes less than 2 seconds.     Findings: Bruising  present.     Comments: Patient does have small superficial abrasions noted at the right medial malleolus.  These are hemostatic on exam.  Neurological:     General: No focal deficit present.     Mental Status: She is alert and oriented to person, place, and time.  Psychiatric:        Mood and Affect: Mood normal.     (all labs ordered are listed, but only abnormal results are displayed) Labs Reviewed  BASIC METABOLIC PANEL WITH GFR  CBC WITH DIFFERENTIAL/PLATELET    EKG: None  Radiology: CT ANKLE RIGHT WO CONTRAST Result Date: 02/02/2024 CLINICAL DATA:  Ankle trauma fracture EXAM: CT OF THE RIGHT ANKLE WITHOUT CONTRAST TECHNIQUE: Multidetector CT imaging of the right ankle was performed  according to the standard protocol. Multiplanar CT image reconstructions were also generated. RADIATION DOSE REDUCTION: This exam was performed according to the departmental dose-optimization program which includes automated exposure control, adjustment of the mA and/or kV according to patient size and/or use of iterative reconstruction technique. COMPARISON:  02/02/2024 FINDINGS: Bones/Joint/Cartilage Acute mildly comminuted fracture involving the distal shaft and metaphysis of the fibula with residual 6 mm lateral and 7 mm posterior displacement of dominant distal fracture fragment. Acute medial malleolar fracture with residual 7 mm lateral displacement. Punctate fracture fragments at the medial joint space, coronal series 5, image 28. Also small intra-articular osseous fragments anteriorly, sagittal series 3 image 31, on the lateral side. Acute mildly comminuted and displaced posterior malleolar fracture. Mild residual widening of the anterior joint space. Residual 9 mm lateral subluxation of the talar dome with respect to the distal tibia. More corticated punctate calcifications on the medial side of talonavicular joint, favored to represent small ossicles rather than fracture fragments. Ligaments Suboptimally assessed by CT. Muscles and Tendons No intramuscular fluid collections. No abnormal atrophy. No abnormal tendon sheath fluid collections. Soft tissues Circumferential edema at the lower leg and ankle. IMPRESSION: 1. Acute trimalleolar fracture with residual fracture displacement as described above. Residual 9 mm lateral subluxation of the talar dome with respect to the distal tibia and mild asymmetrical widening of the anterior joint space. Punctate intra-articular osseous fragments at the superomedial joint and the anterior joint space on the lateral side. 2. Circumferential edema at the lower leg and ankle. Electronically Signed   By: Luke Bun M.D.   On: 02/02/2024 23:12   DG Ankle Complete  Right Result Date: 02/02/2024 EXAM: 3 OR MORE VIEW(S) XRAY OF THE RIGHT ANKLE 02/02/2024 10:17:00 PM CLINICAL HISTORY: injury COMPARISON: Right ankle radiographs earlier today. FINDINGS: BONES AND JOINTS: Trimalleolar fractures, mildly displaced, although improved with overlying cast. Persistent lateral talar shift with reduction of prior posterior ankle dislocation. SOFT TISSUES: Associated soft tissue swelling. IMPRESSION: 1. Trimalleolar fractures, mildly displaced, improved. 2. Persistent lateral talar shift with reduction of prior posterior ankle dislocation. Electronically signed by: Pinkie Pebbles MD 02/02/2024 10:22 PM EST RP Workstation: [id number]   DG Ankle Complete Right Result Date: 02/02/2024 EXAM: 3 OR MORE VIEW(S) XRAY OF THE ANKLE 02/02/2024 06:52:00 PM CLINICAL HISTORY: injury COMPARISON: None available. FINDINGS: BONES AND JOINTS: Acute displaced distal fibular fracture. Acute displaced medial malleolar fracture. Acute displaced posterior malleolar fracture. Tibiotalar joint dislocation with posterolateral displacement of the talus in relation to the tibial plafond. SOFT TISSUES: Diffuse soft tissue swelling of the ankle. IMPRESSION: 1. Acute displaced distal fibular, medial malleolar, and posterior malleolar fractures. 2. Tibiotalar joint dislocation with posterolateral displacement of the talus relative to the tibial plafond. 3. Diffuse  soft tissue swelling of the ankle. Electronically signed by: Lonni Necessary MD 02/02/2024 07:14 PM EST RP Workstation: [id number]     .Reduction of dislocation  Date/Time: 02/02/2024 10:01 PM  Performed by: Francis Ileana SAILOR, PA-C Authorized by: Francis Ileana SAILOR, PA-C  Consent: Verbal consent obtained Risks and benefits: risks, benefits and alternatives were discussed Consent given by: patient Patient understanding: patient states understanding of the procedure being performed Patient consent: the patient's understanding of the procedure  matches consent given Procedure consent: procedure consent matches procedure scheduled Imaging studies: imaging studies available Patient identity confirmed: arm band Time out: Immediately prior to procedure a time out was called to verify the correct patient, procedure, equipment, support staff and site/side marked as required. Local anesthesia used: no  Anesthesia: Local anesthesia used: no  Sedation: Patient sedated: yes Sedation type: moderate (conscious) sedation Sedatives: propofol  Vitals: Vital signs were monitored during sedation.  Patient tolerance: patient tolerated the procedure well with no immediate complications      Medications Ordered in the ED  morphine  (PF) 2 MG/ML injection (2 mg Intravenous Given 02/02/24 1838)  ondansetron  (ZOFRAN ) 4 MG/2ML injection (4 mg Intravenous Given 02/02/24 1839)  morphine  (PF) 2 MG/ML injection 2 mg (2 mg Intravenous Given 02/02/24 1930)  propofol  (DIPRIVAN ) 10 mg/mL bolus/IV push 40.8 mg (20 mg Intravenous Given 02/02/24 2146)  propofol  (DIPRIVAN ) 10 mg/mL bolus/IV push 40.8 mg (40.8 mg Intravenous Given 02/02/24 2145)                                    Medical Decision Making Amount and/or Complexity of Data Reviewed Labs: ordered. Radiology: ordered.  Risk Prescription drug management. Decision regarding hospitalization.   This patient presents to the ED for concern of right foot injury differential diagnosis includes fracture, dislocation, open fracture, musculoskeletal pain    Additional history obtained   Additional history obtained from Electronic Medical Record External records from outside source obtained and reviewed including Care Everywhere   Lab Tests:  I Ordered, and personally interpreted labs.  The pertinent results include: pending   Imaging Studies ordered:  I ordered imaging studies including right ankle x-rays I independently visualized and interpreted imaging which showed acute  displaced distal fibular, medial malleolus, and posterior malleoli are fractures.  Tibiotalar joint dislocation with posterior lateral displacement of the talus relative to the tibial plafond. I agree with the radiologist interpretation   Medicines ordered and prescription drug management:  I ordered medication including morphine  and Zofran     I have reviewed the patients home medicines and have made adjustments as needed   Problem List / ED Course:  Consulted Dr. Fidel who recommended reduction, splint, postreduction films, film review and possible CT imaging. Patient signed out to Mary Slocumb, PA-C pending labs and hospitalist admission.     Final diagnoses:  Closed trimalleolar fracture of right ankle, initial encounter    ED Discharge Orders     None          Francis Ileana SAILOR, PA-C 02/03/24 0001    Neysa Caron PARAS, DO 02/05/24 1608

## 2024-02-02 NOTE — ED Triage Notes (Signed)
 Pt came in with right foot pain, states she stepped off of her golf cart and missed the step and fell. No LOC, did not hit her head, on baby aspirin . A&Ox4

## 2024-02-02 NOTE — ED Notes (Signed)
 X-ray at bedside.

## 2024-02-03 ENCOUNTER — Encounter (HOSPITAL_COMMUNITY): Admission: EM | Disposition: A | Discharge status: Home Health | Payer: Self-pay | Source: Home / Self Care

## 2024-02-03 ENCOUNTER — Inpatient Hospital Stay (HOSPITAL_COMMUNITY)

## 2024-02-03 ENCOUNTER — Inpatient Hospital Stay (HOSPITAL_COMMUNITY): Admitting: Anesthesiology

## 2024-02-03 ENCOUNTER — Encounter (HOSPITAL_COMMUNITY): Payer: Self-pay | Admitting: Internal Medicine

## 2024-02-03 DIAGNOSIS — E785 Hyperlipidemia, unspecified: Secondary | ICD-10-CM | POA: Diagnosis present

## 2024-02-03 DIAGNOSIS — R0989 Other specified symptoms and signs involving the circulatory and respiratory systems: Secondary | ICD-10-CM | POA: Diagnosis not present

## 2024-02-03 DIAGNOSIS — I10 Essential (primary) hypertension: Secondary | ICD-10-CM | POA: Diagnosis not present

## 2024-02-03 DIAGNOSIS — R739 Hyperglycemia, unspecified: Secondary | ICD-10-CM | POA: Diagnosis present

## 2024-02-03 DIAGNOSIS — S82891A Other fracture of right lower leg, initial encounter for closed fracture: Secondary | ICD-10-CM | POA: Diagnosis not present

## 2024-02-03 DIAGNOSIS — R918 Other nonspecific abnormal finding of lung field: Secondary | ICD-10-CM | POA: Diagnosis not present

## 2024-02-03 DIAGNOSIS — S82851A Displaced trimalleolar fracture of right lower leg, initial encounter for closed fracture: Secondary | ICD-10-CM | POA: Diagnosis not present

## 2024-02-03 DIAGNOSIS — W1840XA Slipping, tripping and stumbling without falling, unspecified, initial encounter: Secondary | ICD-10-CM | POA: Diagnosis not present

## 2024-02-03 DIAGNOSIS — Z8673 Personal history of transient ischemic attack (TIA), and cerebral infarction without residual deficits: Secondary | ICD-10-CM | POA: Diagnosis not present

## 2024-02-03 DIAGNOSIS — J984 Other disorders of lung: Secondary | ICD-10-CM | POA: Diagnosis not present

## 2024-02-03 DIAGNOSIS — N1831 Chronic kidney disease, stage 3a: Secondary | ICD-10-CM | POA: Diagnosis present

## 2024-02-03 DIAGNOSIS — J9811 Atelectasis: Secondary | ICD-10-CM | POA: Diagnosis present

## 2024-02-03 DIAGNOSIS — Z87891 Personal history of nicotine dependence: Secondary | ICD-10-CM

## 2024-02-03 DIAGNOSIS — E559 Vitamin D deficiency, unspecified: Secondary | ICD-10-CM | POA: Diagnosis present

## 2024-02-03 HISTORY — PX: ORIF ANKLE FRACTURE: SHX5408

## 2024-02-03 LAB — CBC WITH DIFFERENTIAL/PLATELET
Abs Immature Granulocytes: 0.01 K/uL (ref 0.00–0.07)
Abs Immature Granulocytes: 0.02 K/uL (ref 0.00–0.07)
Basophils Absolute: 0 K/uL (ref 0.0–0.1)
Basophils Absolute: 0.1 K/uL (ref 0.0–0.1)
Basophils Relative: 1 %
Basophils Relative: 1 %
Eosinophils Absolute: 0.1 K/uL (ref 0.0–0.5)
Eosinophils Absolute: 0.2 K/uL (ref 0.0–0.5)
Eosinophils Relative: 1 %
Eosinophils Relative: 3 %
HCT: 34.3 % — ABNORMAL LOW (ref 36.0–46.0)
HCT: 38.9 % (ref 36.0–46.0)
Hemoglobin: 10.8 g/dL — ABNORMAL LOW (ref 12.0–15.0)
Hemoglobin: 12.5 g/dL (ref 12.0–15.0)
Immature Granulocytes: 0 %
Immature Granulocytes: 0 %
Lymphocytes Relative: 21 %
Lymphocytes Relative: 31 %
Lymphs Abs: 2 K/uL (ref 0.7–4.0)
Lymphs Abs: 2.2 K/uL (ref 0.7–4.0)
MCH: 28.6 pg (ref 26.0–34.0)
MCH: 29 pg (ref 26.0–34.0)
MCHC: 31.5 g/dL (ref 30.0–36.0)
MCHC: 32.1 g/dL (ref 30.0–36.0)
MCV: 89 fL (ref 80.0–100.0)
MCV: 92 fL (ref 80.0–100.0)
Monocytes Absolute: 0.6 K/uL (ref 0.1–1.0)
Monocytes Absolute: 0.6 K/uL (ref 0.1–1.0)
Monocytes Relative: 6 %
Monocytes Relative: 8 %
Neutro Abs: 4.1 K/uL (ref 1.7–7.7)
Neutro Abs: 7.1 K/uL (ref 1.7–7.7)
Neutrophils Relative %: 57 %
Neutrophils Relative %: 71 %
Platelets: 228 K/uL (ref 150–400)
Platelets: 268 K/uL (ref 150–400)
RBC: 3.73 MIL/uL — ABNORMAL LOW (ref 3.87–5.11)
RBC: 4.37 MIL/uL (ref 3.87–5.11)
RDW: 13.7 % (ref 11.5–15.5)
RDW: 13.9 % (ref 11.5–15.5)
WBC: 7.1 K/uL (ref 4.0–10.5)
WBC: 9.9 K/uL (ref 4.0–10.5)
nRBC: 0 % (ref 0.0–0.2)
nRBC: 0 % (ref 0.0–0.2)

## 2024-02-03 LAB — CREATININE, SERUM
Creatinine, Ser: 1.28 mg/dL — ABNORMAL HIGH (ref 0.44–1.00)
GFR, Estimated: 42 mL/min — ABNORMAL LOW (ref >60)

## 2024-02-03 LAB — BASIC METABOLIC PANEL WITH GFR
Anion gap: 10 (ref 5–15)
BUN: 12 mg/dL (ref 8–23)
CO2: 26 mmol/L (ref 22–32)
Calcium: 8.7 mg/dL — ABNORMAL LOW (ref 8.9–10.3)
Chloride: 103 mmol/L (ref 98–111)
Creatinine, Ser: 0.87 mg/dL (ref 0.44–1.00)
GFR, Estimated: >60 mL/min (ref >60)
Glucose, Bld: 111 mg/dL — ABNORMAL HIGH (ref 70–99)
Potassium: 3.8 mmol/L (ref 3.5–5.1)
Sodium: 139 mmol/L (ref 135–145)

## 2024-02-03 MED ORDER — FENTANYL CITRATE (PF) 100 MCG/2ML IJ SOLN
INTRAMUSCULAR | Status: DC | PRN
  Administered 2024-02-03: 25 ug via INTRAVENOUS

## 2024-02-03 MED ORDER — ROPIVACAINE HCL 5 MG/ML IJ SOLN
INTRAMUSCULAR | Status: DC | PRN
  Administered 2024-02-03: 30 mL via PERINEURAL
  Administered 2024-02-03: 11 mL via PERINEURAL

## 2024-02-03 MED ORDER — AMITRIPTYLINE HCL 10 MG PO TABS
30.0000 mg | ORAL_TABLET | Freq: Every day | ORAL | Status: DC
  Administered 2024-02-03 – 2024-02-06 (×4): 30 mg via ORAL
  Filled 2024-02-03 (×4): qty 3

## 2024-02-03 MED ORDER — POVIDONE-IODINE 10 % EX SWAB: 2.0000 | Freq: Once | CUTANEOUS | Status: DC

## 2024-02-03 MED ORDER — KCL IN DEXTROSE-NACL 20-5-0.45 MEQ/L-%-% IV SOLN: INTRAVENOUS | Status: DC

## 2024-02-03 MED ORDER — VITAMIN D 25 MCG (1000 UNIT) PO TABS
1000.0000 [IU] | ORAL_TABLET | Freq: Every day | ORAL | Status: DC
  Administered 2024-02-03 – 2024-02-07 (×5): 1000 [IU] via ORAL
  Filled 2024-02-03 (×5): qty 1

## 2024-02-03 MED ORDER — ONDANSETRON HCL 4 MG PO TABS
4.0000 mg | ORAL_TABLET | Freq: Four times a day (QID) | ORAL | Status: DC | PRN
Start: 2024-02-03 — End: 2024-02-03

## 2024-02-03 MED ORDER — CYANOCOBALAMIN 500 MCG PO TABS
250.0000 ug | ORAL_TABLET | Freq: Every day | ORAL | Status: DC
  Administered 2024-02-03 – 2024-02-07 (×5): 250 ug via ORAL
  Filled 2024-02-03 (×5): qty 1

## 2024-02-03 MED ORDER — 0.9 % SODIUM CHLORIDE (POUR BTL) OPTIME
TOPICAL | Status: DC | PRN
  Administered 2024-02-03: 1000 mL

## 2024-02-03 MED ORDER — ENOXAPARIN SODIUM 40 MG/0.4ML IJ SOSY
40.0000 mg | PREFILLED_SYRINGE | INTRAMUSCULAR | Status: DC
  Administered 2024-02-04 – 2024-02-07 (×4): 40 mg via SUBCUTANEOUS
  Filled 2024-02-03 (×4): qty 0.4

## 2024-02-03 MED ORDER — BISACODYL 10 MG RE SUPP
10.0000 mg | Freq: Every day | RECTAL | Status: DC | PRN
Start: 2024-02-03 — End: 2024-02-07
  Administered 2024-02-06 – 2024-02-07 (×2): 10 mg via RECTAL
  Filled 2024-02-03 (×3): qty 1

## 2024-02-03 MED ORDER — ONDANSETRON HCL 4 MG/2ML IJ SOLN
4.0000 mg | Freq: Four times a day (QID) | INTRAMUSCULAR | Status: DC | PRN
Start: 2024-02-03 — End: 2024-02-03

## 2024-02-03 MED ORDER — CEFAZOLIN SODIUM-DEXTROSE 2-4 GM/100ML-% IV SOLN
2.0000 g | INTRAVENOUS | Status: AC
  Administered 2024-02-03: 2 g via INTRAVENOUS

## 2024-02-03 MED ORDER — SENNOSIDES-DOCUSATE SODIUM 8.6-50 MG PO TABS
1.0000 | ORAL_TABLET | Freq: Every evening | ORAL | Status: DC | PRN
Start: 2024-02-03 — End: 2024-02-07
  Administered 2024-02-06: 1 via ORAL
  Filled 2024-02-03: qty 1

## 2024-02-03 MED ORDER — ORAL CARE MOUTH RINSE: 15.0000 mL | OROMUCOSAL | Status: DC | PRN

## 2024-02-03 MED ORDER — NAPROXEN 250 MG PO TABS
250.0000 mg | ORAL_TABLET | Freq: Two times a day (BID) | ORAL | Status: DC
  Administered 2024-02-03 – 2024-02-07 (×8): 250 mg via ORAL
  Filled 2024-02-03 (×8): qty 1

## 2024-02-03 MED ORDER — KETOROLAC TROMETHAMINE 15 MG/ML IJ SOLN: 15.0000 mg | Freq: Once | INTRAMUSCULAR | Status: DC

## 2024-02-03 MED ORDER — CEFAZOLIN SODIUM-DEXTROSE 2-4 GM/100ML-% IV SOLN
INTRAVENOUS | Status: AC
  Filled 2024-02-03: qty 100

## 2024-02-03 MED ORDER — FENTANYL CITRATE (PF) 50 MCG/ML IJ SOSY
50.0000 ug | PREFILLED_SYRINGE | Freq: Once | INTRAMUSCULAR | Status: AC
  Administered 2024-02-03: 50 ug via INTRAVENOUS
  Filled 2024-02-03: qty 1

## 2024-02-03 MED ORDER — HYDROCODONE-ACETAMINOPHEN 5-325 MG PO TABS
1.0000 | ORAL_TABLET | Freq: Four times a day (QID) | ORAL | Status: DC | PRN
  Administered 2024-02-04 – 2024-02-07 (×8): 1 via ORAL
  Filled 2024-02-03 (×8): qty 1

## 2024-02-03 MED ORDER — FENTANYL CITRATE (PF) 50 MCG/ML IJ SOSY
50.0000 ug | PREFILLED_SYRINGE | INTRAMUSCULAR | Status: DC | PRN
  Administered 2024-02-03 – 2024-02-05 (×2): 50 ug via INTRAVENOUS
  Filled 2024-02-03 (×2): qty 1

## 2024-02-03 MED ORDER — ONDANSETRON HCL 4 MG/2ML IJ SOLN
4.0000 mg | Freq: Once | INTRAMUSCULAR | Status: AC | PRN
  Administered 2024-02-03: 4 mg via INTRAVENOUS

## 2024-02-03 MED ORDER — SODIUM CHLORIDE 0.9 % IV BOLUS
1000.0000 mL | Freq: Once | INTRAVENOUS | Status: AC
  Administered 2024-02-03: 1000 mL via INTRAVENOUS

## 2024-02-03 MED ORDER — ONDANSETRON HCL 4 MG/2ML IJ SOLN
INTRAMUSCULAR | Status: AC
  Filled 2024-02-03: qty 2

## 2024-02-03 MED ORDER — ACETAMINOPHEN 10 MG/ML IV SOLN
1000.0000 mg | Freq: Once | INTRAVENOUS | Status: DC | PRN
  Administered 2024-02-03: 1000 mg via INTRAVENOUS

## 2024-02-03 MED ORDER — ASPIRIN 81 MG PO TBEC
81.0000 mg | DELAYED_RELEASE_TABLET | Freq: Every morning | ORAL | Status: DC
  Administered 2024-02-04 – 2024-02-07 (×4): 81 mg via ORAL
  Filled 2024-02-03 (×4): qty 1

## 2024-02-03 MED ORDER — PROPOFOL 10 MG/ML IV BOLUS
INTRAVENOUS | Status: DC | PRN
  Administered 2024-02-03: 30 mg via INTRAVENOUS
  Administered 2024-02-03: 45 ug/kg/min via INTRAVENOUS

## 2024-02-03 MED ORDER — LACTATED RINGERS IV SOLN: INTRAVENOUS | Status: DC | PRN

## 2024-02-03 MED ORDER — ONDANSETRON HCL 4 MG/2ML IJ SOLN: 4.0000 mg | Freq: Four times a day (QID) | INTRAMUSCULAR | Status: DC | PRN

## 2024-02-03 MED ORDER — OXYCODONE-ACETAMINOPHEN 5-325 MG PO TABS
1.0000 | ORAL_TABLET | Freq: Once | ORAL | Status: AC
  Administered 2024-02-03: 1 via ORAL
  Filled 2024-02-03: qty 1

## 2024-02-03 MED ORDER — MORPHINE SULFATE (PF) 4 MG/ML IV SOLN
2.0000 mg | Freq: Once | INTRAVENOUS | Status: DC
Start: 2024-02-03 — End: 2024-02-03

## 2024-02-03 MED ORDER — ACETAMINOPHEN 325 MG PO TABS
650.0000 mg | ORAL_TABLET | Freq: Four times a day (QID) | ORAL | Status: DC | PRN
  Administered 2024-02-04 – 2024-02-07 (×2): 650 mg via ORAL
  Filled 2024-02-03 (×3): qty 2

## 2024-02-03 MED ORDER — CHLORHEXIDINE GLUCONATE 4 % EX SOLN: 60.0000 mL | Freq: Once | CUTANEOUS | Status: DC

## 2024-02-03 MED ORDER — PHENYLEPHRINE HCL-NACL 20-0.9 MG/250ML-% IV SOLN
INTRAVENOUS | Status: AC
  Filled 2024-02-03: qty 250

## 2024-02-03 MED ORDER — ACETAMINOPHEN 650 MG RE SUPP
650.0000 mg | Freq: Four times a day (QID) | RECTAL | Status: DC | PRN
Start: 2024-02-03 — End: 2024-02-07

## 2024-02-03 MED ORDER — EZETIMIBE 10 MG PO TABS
10.0000 mg | ORAL_TABLET | Freq: Every day | ORAL | Status: DC
  Administered 2024-02-03 – 2024-02-07 (×5): 10 mg via ORAL
  Filled 2024-02-03 (×5): qty 1

## 2024-02-03 MED ORDER — PHENYLEPHRINE 80 MCG/ML (10ML) SYRINGE FOR IV PUSH (FOR BLOOD PRESSURE SUPPORT)
PREFILLED_SYRINGE | INTRAVENOUS | Status: AC
  Filled 2024-02-03: qty 10

## 2024-02-03 MED ORDER — ACETAMINOPHEN 10 MG/ML IV SOLN
INTRAVENOUS | Status: AC
  Filled 2024-02-03: qty 100

## 2024-02-03 MED ORDER — ONDANSETRON HCL 4 MG PO TABS: 4.0000 mg | ORAL_TABLET | Freq: Four times a day (QID) | ORAL | Status: DC | PRN

## 2024-02-03 MED ORDER — BACITRACIN ZINC 500 UNIT/GM EX OINT
TOPICAL_OINTMENT | CUTANEOUS | Status: AC
  Filled 2024-02-03: qty 28.35

## 2024-02-03 MED ORDER — FENTANYL CITRATE (PF) 50 MCG/ML IJ SOSY: 25.0000 ug | PREFILLED_SYRINGE | INTRAMUSCULAR | Status: DC | PRN

## 2024-02-03 MED ORDER — SODIUM CHLORIDE 0.9 % IV SOLN: INTRAVENOUS | Status: AC

## 2024-02-03 MED ORDER — DOCUSATE SODIUM 100 MG PO CAPS
100.0000 mg | ORAL_CAPSULE | Freq: Two times a day (BID) | ORAL | Status: DC
  Administered 2024-02-03 – 2024-02-07 (×9): 100 mg via ORAL
  Filled 2024-02-03 (×10): qty 1

## 2024-02-03 MED ORDER — PHENYLEPHRINE 80 MCG/ML (10ML) SYRINGE FOR IV PUSH (FOR BLOOD PRESSURE SUPPORT)
PREFILLED_SYRINGE | INTRAVENOUS | Status: DC | PRN
  Administered 2024-02-03 (×3): 120 ug via INTRAVENOUS
  Administered 2024-02-03: 80 ug via INTRAVENOUS
  Administered 2024-02-03: 120 ug via INTRAVENOUS

## 2024-02-03 MED ORDER — BUPIVACAINE LIPOSOME 1.3 % IJ SUSP
INTRAMUSCULAR | Status: AC
Start: 2024-02-03 — End: 2024-02-03
  Filled 2024-02-03: qty 20

## 2024-02-03 MED ORDER — FENTANYL CITRATE (PF) 100 MCG/2ML IJ SOLN
INTRAMUSCULAR | Status: AC
  Filled 2024-02-03: qty 2

## 2024-02-03 MED ORDER — VANCOMYCIN HCL 1000 MG IV SOLR
INTRAVENOUS | Status: AC
  Filled 2024-02-03: qty 20

## 2024-02-03 MED ORDER — LIDOCAINE HCL (PF) 2 % IJ SOLN
INTRAMUSCULAR | Status: AC
Start: 2024-02-03 — End: 2024-02-03
  Filled 2024-02-03: qty 5

## 2024-02-03 MED ORDER — VANCOMYCIN HCL 1000 MG IV SOLR
INTRAVENOUS | Status: DC | PRN
  Administered 2024-02-03: 1000 mg

## 2024-02-03 MED ORDER — ROCURONIUM BROMIDE 10 MG/ML (PF) SYRINGE
PREFILLED_SYRINGE | INTRAVENOUS | Status: AC
  Filled 2024-02-03: qty 10

## 2024-02-03 MED ORDER — FENTANYL CITRATE (PF) 50 MCG/ML IJ SOSY
50.0000 ug | PREFILLED_SYRINGE | INTRAMUSCULAR | Status: AC | PRN
Start: 2024-02-03 — End: 2024-02-03
  Administered 2024-02-03 (×3): 50 ug via INTRAVENOUS
  Filled 2024-02-03 (×3): qty 1

## 2024-02-03 NOTE — Op Note (Signed)
 02/03/2024  1:33 PM  PATIENT:  Mary Adkins  83 y.o. female  PRE-OPERATIVE DIAGNOSIS: Right ankle displaced trimalleolar fracture  POST-OPERATIVE DIAGNOSIS: Same  Procedure(s): 1.  Open treatment right ankle trimalleolar fracture with internal fixation including fixation of the posterior malleolus 2.  AP, mortise and lateral radiographs of the right ankle 3.  Stress examination of the right ankle under fluoroscopy  SURGEON:  Norleen Armor, MD  ASSISTANT: None  ANESTHESIA:   MAC, regional  EBL:  minimal   TOURNIQUET: 57 minutes at 350 mmHg thigh  COMPLICATIONS:  None apparent  DISPOSITION:  Extubated, awake and stable to recovery.  INDICATION FOR PROCEDURE: 83 year old female without significant past medical history missed a step and injured her left ankle yesterday.  She has a displaced trimalleolar fracture of her right ankle.  She presents now for operative treatment of this displaced and unstable right ankle injury.  The risks and benefits of the alternative treatment options have been discussed in detail.  The patient wishes to proceed with surgery and specifically understands risks of bleeding, infection, nerve damage, blood clots, need for additional surgery, amputation and death.   PROCEDURE IN DETAIL:  After pre operative consent was obtained, and the correct operative site was identified, the patient was brought to the operating room and placed supine on the OR table.  Anesthesia was administered.  Pre-operative antibiotics were administered.  A surgical timeout was taken.  The right lower extremity was prepped and draped in standard sterile fashion with a tourniquet around the thigh.  Extremity was elevated, and the tourniquet was inflated to 350 mmHg.  A longitudinal incision was made over the lateral malleolus.  Dissection was carried sharply down through the subcutaneous tissues.  The fracture site was identified.  It was cleaned of all hematoma and irrigated.  The  posterior malleolus fracture was identified through the lateral malleolus fracture.  It was also irrigated and debrided of all hematoma.  The lateral malleolus was then reduced and held with a tenaculum and a lobster claw.  A 3.5 mm fully threaded lag screw was inserted from posterior to anterior across the fracture site.  It was noted to have adequate purchase.  A 7 hole one third tubular plate from the [employer] small frag set was then contoured to fit the lateral malleolus.  It was secured distally with 3 unicortical screws and proximally with 3 bicortical screws.  A large Weber tenaculum was placed behind the peroneals and the tip used to engage the posterior malleolus fracture fragment.  The posterior malleolus fracture was reduced.  A small incision was made at the anterior ankle.  Dissection was carried bluntly down through the subcutaneous tissues to the anterior tibia.  The tenaculum was tightened securely.  A lateral radiograph confirmed appropriate reduction of the posterior malleolus fragment.  A K wire was then advanced from anterior to posterior across the fracture site.  The K wire was overdrilled.  A 4 mm partially-threaded cannulated screw was inserted.  It was noted to have adequate purchase.  Attention was turned to the medial side of the ankle.  A longitudinal incision was made over the medial malleolus.  The medial malleolus fracture site was identified.  It was irrigated copiously and debrided of all hematoma.  The periosteum was excised at the fracture site.  The fracture was reduced.  A pointed tenaculum was used to hold the fracture.  AP and lateral radiographs confirmed appropriate reduction of the medial malleolus fracture.  2 K  wires were inserted across the fracture line.  Radiographs confirmed appropriate position of both wires.  4 mm x 40 mm partially-threaded cannulated screws were inserted after drilling.  Both have adequate purchase and compressed the fracture site  appropriately.  AP, mortise and lateral radiographs confirmed appropriate reduction of all 3 fractures in appropriate position and length of all hardware.  Stress examination was then performed.  Dorsiflexion external rotation stress was applied to the supinated forefoot.  There was no instability evident at the syndesmosis.  The wounds were irrigated copiously and sprinkled with vancomycin  powder.  Subcutaneous tissues were approximated with 3-0 Monocryl.  Skin incisions were closed with 3-0 Prolene.  Sterile dressings were applied followed by a well-padded short leg splint.  Tourniquet was released after application of the dressings.  The patient was awakened from anesthesia and transported to the recovery room in stable condition.  FOLLOW UP PLAN: The patient will be admitted to the hospitalist service for physical therapy and Occupational Therapy.  She will start Lovenox  on postop day 1.  Nonweightbearing for 6 weeks postop.  Follow-up in the office in 2 weeks for suture removal and conversion to a short leg cast.   RADIOGRAPHS: AP, mortise and lateral radiographs of the right ankle show interval reduction of the trimalleolar ankle fracture with appropriate position and length of all hardware.  No other acute injuries are noted.

## 2024-02-03 NOTE — Anesthesia Postprocedure Evaluation (Signed)
 Anesthesia Post Note  Patient: Mary Adkins  Procedure(s) Performed: OPEN REDUCTION INTERNAL FIXATION (ORIF) ANKLE FRACTURE (Right: Ankle)     Patient location during evaluation: PACU Anesthesia Type: Regional Level of consciousness: awake and alert Pain management: pain level controlled Vital Signs Assessment: post-procedure vital signs reviewed and stable Respiratory status: spontaneous breathing, nonlabored ventilation, respiratory function stable and patient connected to nasal cannula oxygen Cardiovascular status: stable and blood pressure returned to baseline Postop Assessment: no apparent nausea or vomiting Anesthetic complications: no   No notable events documented.  Last Vitals:  Vitals:   02/03/24 1415 02/03/24 1436  BP: 139/71 130/66  Pulse: 74 75  Resp: 15   Temp:    SpO2: 100% 98%    Last Pain:  Vitals:   02/03/24 1500  TempSrc:   PainSc: 0-No pain                 Garnette DELENA Gab

## 2024-02-03 NOTE — Anesthesia Procedure Notes (Addendum)
 Anesthesia Regional Block: Adductor canal block   Pre-Anesthetic Checklist: , timeout performed,  Correct Patient, Correct Site, Correct Laterality,  Correct Procedure, Correct Position, site marked,  Risks and benefits discussed,  Surgical consent,  Pre-op evaluation,  At surgeon's request and post-op pain management  Laterality: Lower and Right  Prep: chloraprep       Needles:  Injection technique: Single-shot  Needle Type: Echogenic Needle     Needle Length: 9cm  Needle Gauge: 22     Additional Needles:   Procedures:,,,, ultrasound used (permanent image in chart),,    Narrative:  Start time: 02/03/2024 11:50 AM End time: 02/03/2024 11:53 AM Injection made incrementally with aspirations every 5 mL.  Performed by: Personally  Anesthesiologist: Jefm Garnette LABOR, MD  Additional Notes: Block assessed prior to surgery. Pt tolerated procedure well.

## 2024-02-03 NOTE — H&P (Signed)
 History and Physical    Patient: Mary Adkins FMW:[id number] DOB: [birth year] DOA: 02/02/2024 DOS: the patient was seen and examined on 02/03/2024 PCP: Brien Josette HERO, PA-C (Inactive)  Patient coming from: Home  Chief Complaint:  Chief Complaint  Patient presents with   Foot Injury   HPI: Mary Adkins is a 83 y.o. 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  No prodromal symptoms, no head trauma or LOC. He denied fever, chills, rhinorrhea, sore throat, wheezing or hemoptysis.  No chest pain, palpitations, diaphoresis, PND, orthopnea or pitting edema of the lower extremities.  No abdominal pain, nausea, emesis, diarrhea, constipation, melena or hematochezia.  No flank pain, dysuria, frequency or hematuria.  No polyuria, polydipsia, polyphagia or blurred vision.   Lab work: CBC was normal.  BMP showed a glucose of 111 and a calcium of 8.7 mg/dL.  The rest of the electrolytes and the renal function were normal.  Imaging: Portable 1 view chest radiograph showing low lung volumes with bibasilar atelectasis.  Right ankle x-ray showing acute displaced distal fibular, medial malleolar and posterior malleolar fractures.  Tibial talar joint dislocation with posterolateral displacement of the talus relative to the tibial plafond.  Diffuse soft tissue swelling of the ankle.  Repeat x-rays after reduction showed improvement displacement and reduction of the posterior ankle dislocation.  CT of the right ankle showing plain x-rays findings and punctate intra-articular which is fragments the superior medial joint and the anterior joint space of the lateral side.  There is S[place]  for initial edema of the lower leg and ankle.   ED course: Initial vital signs were temperature 97.5 F, pulse 98, respirations 20, BP 145/115 mmHg and O2 sat 94% on room air.  She received fentanyl  50 mcg IVP x 4, morphine  2 mg IVP x 1, Percocet 5/325 mg 1 tablet p.o. and propofol  41 mg IVP x 2 during fracture reduction.  Review of Systems: As mentioned in the history of present illness. All other systems reviewed and are negative. Past Medical History:  Diagnosis Date   DJD (degenerative joint disease) of hip    right   Encounter for TB tine test    was + 45 yrs. ago, took med.   Family history of anesthesia complication    pt's mother died during anesthesia but they brought her back   Heart murmur    per pt. & Dr. Gillie   Hypertension    Lumbosacral disc disease    Dr Gillie   Neuromuscular disorder [facility])    hip, back   Spinal stenosis of lumbar region with neurogenic claudication    Stroke [facility])     residual- is vertigo   Past Surgical History:  Procedure Laterality Date   BACK SURGERY  x3 back surgery   BREAST SURGERY  Left, benign results    DILATION AND CURETTAGE OF UTERUS     JOINT REPLACEMENT Right 2015   LUMBAR LAMINECTOMY/DECOMPRESSION MICRODISCECTOMY N/A 05/19/2017   Procedure: LAMINECTOMY AND FORAMINOTOMY LUMBAR THREE- LUMBAR FOUR;  Surgeon: Gillie Duncans, MD;  Location: [facility] OR;  Service: Neurosurgery;  Laterality: N/A;   TONSILLECTOMY     TOTAL HIP ARTHROPLASTY Right 06/01/2012   Procedure: RIGHT TOTAL  HIP ARTHROPLASTY WITH AUTOGRAFT;  Surgeon: LELON JONETTA Shari Mickey., MD;  Location: [facility] OR;  Service: Orthopedics;  Laterality: Right;  RIGHT TOTAL HIP REPLACEMENT ANESTHESIA:  GENERAL, PRE/POST OP FEMORAL NERVE   TOTAL SHOULDER ARTHROPLASTY Right 08/07/2019   Procedure: TOTAL SHOULDER ARTHROPLASTY;  Surgeon: Cristy Bonner DASEN, MD;  Location: [facility] ORS;  Service: Orthopedics;  Laterality: Right;   Social History:  reports that she quit smoking about 12 years ago. Her smoking use  included cigarettes. She has never used smokeless tobacco. She reports that she does not drink alcohol and does not use drugs.  Allergies  Allergen Reactions   Acetazolamide Swelling and Other (See Comments)    Face swelled and peeled   Amlodipine  Other (See Comments)    Dizziness   Atorvastatin Other (See Comments) and Nausea And Vomiting    Muscle aches   Penicillins Rash and Other (See Comments)    NUMBNESS IN ARMS Has patient had a PCN reaction causing immediate rash, facial/tongue/throat swelling, SOB or lightheadedness with hypotension: Unknown Has patient had a PCN reaction causing severe rash involving mucus membranes or skin necrosis: Unknown Has patient had a PCN reaction that required hospitalization: No Has patient had a PCN reaction occurring within the last 10 years: No     Prednisone Nausea And Vomiting and Palpitations    TACHYCARDIA    Rosuvastatin Other (See Comments) and Nausea And Vomiting    Muscle aches   Codeine Nausea And Vomiting   Diazepam Other (See Comments)    Excessive sleepiness    Family History  Problem Relation Age of Onset   Heart attack Mother    Lung cancer Father     Prior to Admission medications   Medication Sig Start Date End Date Taking? Authorizing Provider  amitriptyline (ELAVIL) 10 MG tablet Take 30 mg by mouth at bedtime.   Yes [provider]  aspirin  EC (EQ ASPIRIN  ADULT LOW DOSE) 81 MG tablet Take 81 mg by mouth in the morning.   Yes [provider]  calcium carbonate (SUPER CALCIUM) 1500 (600 Ca) MG TABS tablet Take 1,200 mg by mouth daily with breakfast.   Yes [provider]  cholecalciferol  (VITAMIN D) 1000 UNITS tablet Take 1,000 Units by mouth daily.   Yes [provider]  diclofenac  (VOLTAREN ) 75 MG EC tablet Take 75 mg by mouth 2 (two) times daily. 09/09/14  Yes [provider]  ezetimibe (ZETIA) 10 MG tablet Take 10 mg by mouth daily. 06/23/19  Yes [provider]   nicotine  polacrilex (NICORETTE ) 2 MG gum Take 2 mg by mouth as needed for smoking cessation.    Yes [provider]  vitamin B-12 (CYANOCOBALAMIN ) 500 MCG tablet Take 250 mcg by mouth daily.    Yes [provider]  cetirizine (ZYRTEC) 10 MG tablet Take 10 mg by mouth daily at 2 PM. Patient not taking: Reported on 02/02/2024    [provider]    Physical Exam: Vitals:   02/03/24 0040 02/03/24 0355 02/03/24 0400 02/03/24 0530  BP:  108/68  104/60  Pulse: 93 93 99 92  Resp: 19 16 19 15   Temp: 97.9 F (36.6 C) 97.8 F (36.6 C)    TempSrc: Oral Oral    SpO2:  (!) 89% 94% 97%  Weight:      Height:       Physical Exam Vitals reviewed.  Constitutional:      General: She is awake. She is not in acute distress.  Appearance: She is ill-appearing.  HENT:     Head: Normocephalic.     Nose: No rhinorrhea.     Mouth/Throat:     Mouth: Mucous membranes are moist.  Eyes:     General: No scleral icterus.    Pupils: Pupils are equal, round, and reactive to light.  Neck:     Vascular: No JVD.  Cardiovascular:     Rate and Rhythm: Normal rate and regular rhythm.     Heart sounds: S1 normal and S2 normal.  Pulmonary:     Breath sounds: No wheezing, rhonchi or rales.  Abdominal:     General: Bowel sounds are normal. There is no distension.     Palpations: Abdomen is soft.     Tenderness: There is no abdominal tenderness. There is no right CVA tenderness or left CVA tenderness.  Musculoskeletal:     Cervical back: Neck supple.     Right lower leg: No edema.     Left lower leg: No edema.  Skin:    General: Skin is warm and dry.  Neurological:     General: No focal deficit present.     Mental Status: She is alert and oriented to person, place, and time.  Psychiatric:        Mood and Affect: Mood normal.        Behavior: Behavior normal. Behavior is cooperative.     Data Reviewed:  Results are pending, will review when available.  EKG: Vent. rate  104 BPM PR interval 171 ms QRS duration 90 ms QT/QTcB 339/446 ms P-R-T axes 58 17 57 Sinus tachycardia Low voltage, precordial leads Consider anterior infarct  Assessment and Plan: Principal Problem:   Closed right ankle fracture Admit to telemetry/inpatient. Ice area as needed. Analgesics as needed. Antiemetics as needed. Consult TOC team. Consult nutritional services. PT evaluation after surgery. Orthopedic surgery evaluation appreciated.  Active Problems:   Hypertension On lifestyle measures only. Monitor blood pressure. Antihypertensives as needed.    Hyperlipidemia Continue ezetimibe 10 mg p.o. daily.    Stage 3a chronic kidney disease (HCC) Monitor renal function electrolytes.    Atelectasis Advised to do incentive spirometry frequently.    Hypercalcemia Recheck calcium with albumin  level in AM. Further workup depending on results.    Hyperglycemia Check fasting glucose in AM. Add on hemoglobin A1c if abnormal.    Vitamin D deficiency Continue vitamin D supplementation.     Advance Care Planning:   Code Status: Full Code   Consults: Orthopedic surgery Vilinda Armor, MD).  Family Communication: Her daughter was at bedside.  Severity of Illness: The appropriate patient status for this patient is INPATIENT. Inpatient status is judged to be reasonable and necessary in order to provide the required intensity of service to ensure the patient's safety. The patient's presenting symptoms, physical exam findings, and initial radiographic and laboratory data in the context of their chronic comorbidities is felt to place them at high risk for further clinical deterioration. Furthermore, it is not anticipated that the patient will be medically stable for discharge from the hospital within 2 midnights of admission.   * I certify that at the point of admission it is my clinical judgment that the patient will require inpatient hospital care spanning beyond 2 midnights  from the point of admission due to high intensity of service, high risk for further deterioration and high frequency of surveillance required.*  Author: Alm Dorn Castor, MD 02/03/2024 7:57 AM  For on call review [email or web address].  This document was prepared using Dragon voice recognition software and may contain some unintended transcription errors.

## 2024-02-03 NOTE — Progress Notes (Signed)
 Orthopedic Tech Progress Note Patient Details:  Mary Adkins 1940-11-10 [id number] Splint applied by DO. Ortho techs were not present for the procedure. Patient ID: Mary Adkins, female   DOB: 1941-03-02, 83 y.o.   MRN: [id number]  Mary Adkins 02/03/2024, 12:12 AM

## 2024-02-03 NOTE — Transfer of Care (Addendum)
 Immediate Anesthesia Transfer of Care Note  Patient: Mary Adkins [place]  Procedure(s) Performed: OPEN REDUCTION INTERNAL FIXATION (ORIF) ANKLE FRACTURE (Right: Ankle)  Patient Location: PACU  Anesthesia Type:MAC  Level of Consciousness: drowsy and patient cooperative  Airway & Oxygen Therapy: Patient Spontanous Breathing  Post-op Assessment: Report given to RN and Post -op Vital signs reviewed and stable  Post vital signs: Reviewed and stable  Last Vitals:  Vitals Value Taken Time  BP 95/50 02/03/24 13:33  Temp 36.4 02/03/24   13:33  Pulse 81 02/03/24 13:34  Resp 11 02/03/24 13:34  SpO2 87 % 02/03/24 13:34  Vitals shown include unfiled device data.  Last Pain:  Vitals:   02/03/24 1117  TempSrc:   PainSc: 5          Complications: No notable events documented.36.4

## 2024-02-03 NOTE — Plan of Care (Signed)
   Problem: Nutrition: Goal: Adequate nutrition will be maintained Outcome: Progressing   Problem: Coping: Goal: Level of anxiety will decrease Outcome: Progressing   Problem: Safety: Goal: Ability to remain free from injury will improve Outcome: Progressing

## 2024-02-03 NOTE — ED Notes (Signed)
 Patient placed on oxygen as patient Spo2 dropped after given pain meds

## 2024-02-03 NOTE — Discharge Instructions (Signed)
 Norleen Armor, MD [facility]  Please read the following information regarding your care after surgery.  Medications  You only need a prescription for the narcotic pain medicine (ex. oxycodone , Percocet, Norco).  All of the other medicines listed below are available over the counter. X Aleve 1 pill twice a day for the first 3 days after surgery. X acetominophen (Tylenol ) 650 mg every 4-6 hours as you need for minor to moderate pain X hydrocodone  as prescribed for severe pain  Narcotic pain medicine (ex. oxycodone , Percocet, Vicodin) will cause constipation.  To prevent this problem, take the following medicines while you are taking any pain medicine. X docusate sodium  (Colace) 100 mg twice a day X senna (Senokot) 2 tablets twice a day  X To help prevent blood clots, take Xarelto daily for two weeks after surgery.  You should also get up every hour while you are awake to move around.    Weight Bearing X Do not bear any weight on the operated leg or foot.  Cast / Splint / Dressing X Keep your splint, cast or dressing clean and dry.  Don't put anything (coat hanger, pencil, etc) down inside of it.  If it gets damp, use a hair dryer on the cool setting to dry it.  If it gets soaked, call the office to schedule an appointment for a cast change.  After your dressing, cast or splint is removed; you may shower, but do not soak or scrub the wound.  Allow the water  to run over it, and then gently pat it dry.  Swelling It is normal for you to have swelling where you had surgery.  To reduce swelling and pain, keep your toes above your nose for at least 3 days after surgery.  It may be necessary to keep your foot or leg elevated for several weeks.  If it hurts, it should be elevated.  Follow Up Call my office at [phone] when you are discharged from the hospital or surgery center to schedule an appointment to be seen two weeks after surgery.  Call my office at [phone] if you develop a fever  >101.5 F, nausea, vomiting, bleeding from the surgical site or severe pain.

## 2024-02-03 NOTE — Anesthesia Procedure Notes (Signed)
 Date/Time: 02/03/2024 12:05 PM  Performed by: Para Jerelene CROME, CRNAOxygen Delivery Method: Simple face mask

## 2024-02-03 NOTE — Anesthesia Procedure Notes (Addendum)
 Anesthesia Regional Block: Popliteal block   Pre-Anesthetic Checklist: , timeout performed,  Correct Patient, Correct Site, Correct Laterality,  Correct Procedure, Correct Position, site marked,  Risks and benefits discussed,  Pre-op evaluation,  At surgeon's request and post-op pain management  Laterality: Right and Lower  Prep: Maximum Sterile Barrier Precautions used, chloraprep       Needles:  Injection technique: Single-shot  Needle Type: Echogenic Needle     Needle Length: 9cm  Needle Gauge: 21     Additional Needles:   Procedures:,,,, ultrasound used (permanent image in chart),,    Narrative:  Start time: 02/03/2024 11:45 AM End time: 02/03/2024 11:49 AM Injection made incrementally with aspirations every 5 mL.  Performed by: Personally  Anesthesiologist: Jefm Garnette LABOR, MD  Additional Notes: Block assessed. Patient tolerated procedure well.

## 2024-02-03 NOTE — Consult Note (Signed)
 Reason for Consult: Right ankle pain Referring Physician: Dr. Celinda Charolette DELENA Mary Adkins is an 83 y.o. female.  HPI: 83 year old woman with past medical history significant for stroke missed a step and injured her ankle yesterday.  She last ate at lunchtime yesterday.  She presented to the emergency room via EMS.  Radiographs revealed a displaced trimalleolar fracture of her right ankle.  She underwent closed reduction and splinting.  She has been admitted by the hospitalist service.  She does not take any blood thinners.  She complains of some aching pain in the ankle.  It feels better now that it is splinted.  She denies any history of injury or surgery to the right ankle.  She is not diabetic.  She quit smoking about 15 years ago.  Past Medical History:  Diagnosis Date   DJD (degenerative joint disease) of hip    right   Encounter for TB tine test    was + 45 yrs. ago, took med.   Family history of anesthesia complication    pt's mother died during anesthesia but they brought her back   Heart murmur    per pt. & Dr. 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    Past Surgical History:  Procedure Laterality Date   BACK SURGERY  x3 back surgery   BREAST SURGERY  Left, benign results    DILATION AND CURETTAGE OF UTERUS     JOINT REPLACEMENT Right 2015   LUMBAR LAMINECTOMY/DECOMPRESSION MICRODISCECTOMY N/A 05/19/2017   Procedure: LAMINECTOMY AND FORAMINOTOMY LUMBAR THREE- LUMBAR FOUR;  Surgeon: Gillie Duncans, MD;  Location: [facility] OR;  Service: Neurosurgery;  Laterality: N/A;   TONSILLECTOMY     TOTAL HIP ARTHROPLASTY Right 06/01/2012   Procedure: RIGHT TOTAL HIP ARTHROPLASTY WITH AUTOGRAFT;  Surgeon: LELON JONETTA Shari Mickey., MD;  Location: [facility] OR;  Service: Orthopedics;  Laterality: Right;  RIGHT TOTAL HIP  REPLACEMENT ANESTHESIA:  GENERAL, PRE/POST OP FEMORAL NERVE   TOTAL SHOULDER ARTHROPLASTY Right 08/07/2019   Procedure: TOTAL SHOULDER ARTHROPLASTY;  Surgeon: Cristy Bonner DASEN, MD;  Location: [facility] ORS;  Service: Orthopedics;  Laterality: Right;    Family History  Problem Relation Age of Onset   Heart attack Mother    Lung cancer Father     Social History:  reports that she quit smoking about 12 years ago. Her smoking use included cigarettes. She has never used smokeless tobacco. She reports that she does not drink alcohol and does not use drugs.  Allergies:  Allergies  Allergen Reactions   Acetazolamide Swelling and Other (See Comments)    Face swelled and peeled   Amlodipine  Other (See Comments)    Dizziness   Atorvastatin Other (See Comments) and Nausea And Vomiting    Muscle aches   Penicillins Rash and Other (See Comments)    NUMBNESS IN ARMS Has patient had a PCN reaction causing immediate rash, facial/tongue/throat swelling, SOB or lightheadedness with hypotension: Unknown Has patient had a PCN reaction causing severe rash involving mucus membranes or skin necrosis: Unknown Has patient had a PCN reaction that required hospitalization: No Has patient had a PCN reaction occurring within the last 10 years: No     Prednisone Nausea And Vomiting and Palpitations    TACHYCARDIA    Rosuvastatin Other (  See Comments) and Nausea And Vomiting    Muscle aches   Codeine Nausea And Vomiting   Diazepam Other (See Comments)    Excessive sleepiness    Medications: I have reviewed the patient's current medications.  Results for orders placed or performed during the hospital encounter of 02/02/24 (from the past 48 hours)  Basic metabolic panel     Status: Abnormal   Collection Time: 02/03/24 12:19 AM  Result Value Ref Range   Sodium 139 135 - 145 mmol/L   Potassium 3.8 3.5 - 5.1 mmol/L    Comment: HEMOLYSIS AT THIS LEVEL MAY AFFECT RESULT   Chloride 103 98 - 111 mmol/L   CO2 26 22 -  32 mmol/L   Glucose, Bld 111 (H) 70 - 99 mg/dL    Comment: Glucose reference range applies only to samples taken after fasting for at least 8 hours.   BUN 12 8 - 23 mg/dL   Creatinine, Ser 9.12 0.44 - 1.00 mg/dL   Calcium 8.7 (L) 8.9 - 10.3 mg/dL   GFR, Estimated >39 >39 mL/min    Comment: (NOTE) Calculated using the CKD-EPI Creatinine Equation (2021)    Anion gap 10 5 - 15    Comment: Performed at [facility], [address], [place], [place] [address]  CBC with Differential     Status: None   Collection Time: 02/03/24 12:19 AM  Result Value Ref Range   WBC 9.9 4.0 - 10.5 K/uL   RBC 4.37 3.87 - 5.11 MIL/uL   Hemoglobin 12.5 12.0 - 15.0 g/dL   HCT 61.0 63.9 - 53.9 %   MCV 89.0 80.0 - 100.0 fL   MCH 28.6 26.0 - 34.0 pg   MCHC 32.1 30.0 - 36.0 g/dL   RDW 86.2 88.4 - 84.4 %   Platelets 268 150 - 400 K/uL   nRBC 0.0 0.0 - 0.2 %   Neutrophils Relative % 71 %   Neutro Abs 7.1 1.7 - 7.7 K/uL   Lymphocytes Relative 21 %   Lymphs Abs 2.0 0.7 - 4.0 K/uL   Monocytes Relative 6 %   Monocytes Absolute 0.6 0.1 - 1.0 K/uL   Eosinophils Relative 1 %   Eosinophils Absolute 0.1 0.0 - 0.5 K/uL   Basophils Relative 1 %   Basophils Absolute 0.1 0.0 - 0.1 K/uL   Immature Granulocytes 0 %   Abs Immature Granulocytes 0.01 0.00 - 0.07 K/uL    Comment: Performed at [facility], [address], [place], [place] [address]    DG Chest Port 1 View Result Date: 02/03/2024 CLINICAL DATA:  Preop cardiovascular exam EXAM: PORTABLE CHEST 1 VIEW COMPARISON:  Chest x-ray performed June 01, 2023 FINDINGS: Linear densities are present in the lung bases bilaterally. These are increased when compared to the prior exam. Low lung volumes. Heart size within normal limits. No pleural effusion or pneumothorax. Radiopaque leads project over the thoracic spine. IMPRESSION: 1. Low lung volumes. 2. Worsening linear densities in the lung bases. This most likely represents atelectasis  or scarring which is worsened since the prior exam. Electronically Signed   By: Maude Naegeli M.D.   On: 02/03/2024 09:04   CT ANKLE RIGHT WO CONTRAST Result Date: 02/02/2024 CLINICAL DATA:  Ankle trauma fracture EXAM: CT OF THE RIGHT ANKLE WITHOUT CONTRAST TECHNIQUE: Multidetector CT imaging of the right ankle was performed according to the standard protocol. Multiplanar CT image reconstructions were also generated. RADIATION DOSE REDUCTION: This exam was performed according to  the departmental dose-optimization program which includes automated exposure control, adjustment of the mA and/or kV according to patient size and/or use of iterative reconstruction technique. COMPARISON:  02/02/2024 FINDINGS: Bones/Joint/Cartilage Acute mildly comminuted fracture involving the distal shaft and metaphysis of the fibula with residual 6 mm lateral and 7 mm posterior displacement of dominant distal fracture fragment. Acute medial malleolar fracture with residual 7 mm lateral displacement. Punctate fracture fragments at the medial joint space, coronal series 5, image 28. Also small intra-articular osseous fragments anteriorly, sagittal series 3 image 31, on the lateral side. Acute mildly comminuted and displaced posterior malleolar fracture. Mild residual widening of the anterior joint space. Residual 9 mm lateral subluxation of the talar dome with respect to the distal tibia. More corticated punctate calcifications on the medial side of talonavicular joint, favored to represent small ossicles rather than fracture fragments. Ligaments Suboptimally assessed by CT. Muscles and Tendons No intramuscular fluid collections. No abnormal atrophy. No abnormal tendon sheath fluid collections. Soft tissues Circumferential edema at the lower leg and ankle. IMPRESSION: 1. Acute trimalleolar fracture with residual fracture displacement as described above. Residual 9 mm lateral subluxation of the talar dome with respect to the distal tibia  and mild asymmetrical widening of the anterior joint space. Punctate intra-articular osseous fragments at the superomedial joint and the anterior joint space on the lateral side. 2. Circumferential edema at the lower leg and ankle. Electronically Signed   By: Luke Bun M.D.   On: 02/02/2024 23:12   DG Ankle Complete Right Result Date: 02/02/2024 EXAM: 3 OR MORE VIEW(S) XRAY OF THE RIGHT ANKLE 02/02/2024 10:17:00 PM CLINICAL HISTORY: injury COMPARISON: Right ankle radiographs earlier today. FINDINGS: BONES AND JOINTS: Trimalleolar fractures, mildly displaced, although improved with overlying cast. Persistent lateral talar shift with reduction of prior posterior ankle dislocation. SOFT TISSUES: Associated soft tissue swelling. IMPRESSION: 1. Trimalleolar fractures, mildly displaced, improved. 2. Persistent lateral talar shift with reduction of prior posterior ankle dislocation. Electronically signed by: Pinkie Pebbles MD 02/02/2024 10:22 PM EST RP Workstation: [id number]   DG Ankle Complete Right Result Date: 02/02/2024 EXAM: 3 OR MORE VIEW(S) XRAY OF THE ANKLE 02/02/2024 06:52:00 PM CLINICAL HISTORY: injury COMPARISON: None available. FINDINGS: BONES AND JOINTS: Acute displaced distal fibular fracture. Acute displaced medial malleolar fracture. Acute displaced posterior malleolar fracture. Tibiotalar joint dislocation with posterolateral displacement of the talus in relation to the tibial plafond. SOFT TISSUES: Diffuse soft tissue swelling of the ankle. IMPRESSION: 1. Acute displaced distal fibular, medial malleolar, and posterior malleolar fractures. 2. Tibiotalar joint dislocation with posterolateral displacement of the talus relative to the tibial plafond. 3. Diffuse soft tissue swelling of the ankle. Electronically signed by: Lonni Necessary MD 02/02/2024 07:14 PM EST RP Workstation: [id number]    ROS: No recent fever, chills, nausea, vomiting or changes in her appetite PE:  Blood pressure  110/62, pulse 78, temperature 97.8 F (36.6 C), temperature source Oral, resp. rate 13, height 5' 6 (1.676 m), weight 81.6 kg, SpO2 98%. Well-nourished well-developed woman in no apparent distress.  Alert and oriented.  Normal mood and affect.  Gait is nonweightbearing on the right.  The right ankle is splinted.  The toes have brisk capillary refill.  Active plantarflexion and dorsiflexion strength at the toes.  No lymphadenopathy proximal to the splint.  Intact sensibility to light touch dorsally and plantarly at the forefoot.  Assessment/Plan: Pre and postreduction x-rays are reviewed as well as the CT scan.  She has a trimalleolar ankle fracture.  It is medically necessary  to consider surgical treatment for this displaced and unstable right ankle injury.  She will need open treatment with internal fixation.  She understands the risks and benefits of the alternative treatment options and elects surgical treatment.  The risks and benefits of the alternative treatment options have been discussed in detail.  The patient wishes to proceed with surgery and specifically understands risks of bleeding, infection, nerve damage, blood clots, need for additional surgery, amputation and death.   Norleen Armor 2024-03-02, 11:44 AM

## 2024-02-03 NOTE — Anesthesia Preprocedure Evaluation (Addendum)
 Anesthesia Evaluation  Patient identified by MRN, date of birth, ID band Patient awake    Reviewed: Allergy & Precautions, NPO status , Patient's Chart, lab work & pertinent test results  History of Anesthesia Complications Negative for: history of anesthetic complications  Airway Mallampati: I  TM Distance: >3 FB Neck ROM: Full    Dental no notable dental hx. (+) Edentulous Upper, Edentulous Lower   Pulmonary former smoker   Pulmonary exam normal breath sounds clear to auscultation       Cardiovascular hypertension, (-) angina (-) Past MI Normal cardiovascular exam Rhythm:Regular Rate:Normal     Neuro/Psych CVA (vertigo), Residual Symptoms    GI/Hepatic   Endo/Other  neg diabetes    Renal/GU Renal diseaseLab Results      Component                Value               Date                      NA                       139                 02/03/2024                CL                       103                 02/03/2024                K                        3.8                 02/03/2024                CO2                      26                  02/03/2024                BUN                      12                  02/03/2024                CREATININE               0.87                02/03/2024                GFRNONAA                 >60                 02/03/2024                CALCIUM                  8.7 (L)             02/03/2024  ALBUMIN                   4.2                 09/11/2014                GLUCOSE                  111 (H)             02/03/2024                Musculoskeletal  (+) Arthritis , Osteoarthritis,    Abdominal   Peds  Hematology Lab Results      Component                Value               Date                      WBC                      9.9                 02/03/2024                HGB                      12.5                02/03/2024                HCT                       38.9                02/03/2024                MCV                      89.0                02/03/2024                PLT                      268                 02/03/2024              Anesthesia Other Findings All; See list  Reproductive/Obstetrics                              Anesthesia Physical Anesthesia Plan  ASA: 3 and emergent  Anesthesia Plan: Regional and MAC   Post-op Pain Management: Minimal or no pain anticipated, Regional block* and Ofirmev  IV (intra-op)*   Induction:   PONV Risk Score and Plan: Propofol  infusion, Treatment may vary due to age or medical condition and Ondansetron   Airway Management Planned: Nasal Cannula and Natural Airway  Additional Equipment: None  Intra-op Plan:   Post-operative Plan:   Informed Consent: I have reviewed the patients History and Physical, chart, labs and discussed the procedure including the risks, benefits and alternatives for the proposed anesthesia with the patient or authorized representative who has indicated his/her understanding and acceptance.  Dental advisory given  Plan Discussed with: CRNA and Surgeon  Anesthesia Plan Comments: (R Pop Sciatic + Adductor Canal  + mac)         Anesthesia Quick Evaluation

## 2024-02-04 ENCOUNTER — Encounter (HOSPITAL_COMMUNITY): Payer: Self-pay

## 2024-02-04 DIAGNOSIS — S82891A Other fracture of right lower leg, initial encounter for closed fracture: Secondary | ICD-10-CM | POA: Diagnosis not present

## 2024-02-04 LAB — CBC
HCT: 34.5 % — ABNORMAL LOW (ref 36.0–46.0)
Hemoglobin: 10.9 g/dL — ABNORMAL LOW (ref 12.0–15.0)
MCH: 28.6 pg (ref 26.0–34.0)
MCHC: 31.6 g/dL (ref 30.0–36.0)
MCV: 90.6 fL (ref 80.0–100.0)
Platelets: 198 K/uL (ref 150–400)
RBC: 3.81 MIL/uL — ABNORMAL LOW (ref 3.87–5.11)
RDW: 13.7 % (ref 11.5–15.5)
WBC: 5.6 K/uL (ref 4.0–10.5)
nRBC: 0 % (ref 0.0–0.2)

## 2024-02-04 LAB — COMPREHENSIVE METABOLIC PANEL WITH GFR
ALT: 12 U/L (ref 0–44)
AST: 24 U/L (ref 15–41)
Albumin: 3.6 g/dL (ref 3.5–5.0)
Alkaline Phosphatase: 52 U/L (ref 38–126)
Anion gap: 6 (ref 5–15)
BUN: 14 mg/dL (ref 8–23)
CO2: 29 mmol/L (ref 22–32)
Calcium: 8.9 mg/dL (ref 8.9–10.3)
Chloride: 101 mmol/L (ref 98–111)
Creatinine, Ser: 1.05 mg/dL — ABNORMAL HIGH (ref 0.44–1.00)
GFR, Estimated: 53 mL/min — ABNORMAL LOW (ref >60)
Glucose, Bld: 96 mg/dL (ref 70–99)
Potassium: 4.1 mmol/L (ref 3.5–5.1)
Sodium: 136 mmol/L (ref 135–145)
Total Bilirubin: 0.4 mg/dL (ref 0.0–1.2)
Total Protein: 6.1 g/dL — ABNORMAL LOW (ref 6.5–8.1)

## 2024-02-04 MED ORDER — ASPIRIN 81 MG PO CHEW: 81.0000 mg | CHEWABLE_TABLET | Freq: Two times a day (BID) | ORAL | 0 refills | Status: DC

## 2024-02-04 MED ORDER — HYDROCODONE-ACETAMINOPHEN 5-325 MG PO TABS: 1.0000 | ORAL_TABLET | Freq: Four times a day (QID) | ORAL | 0 refills | Status: DC | PRN

## 2024-02-04 MED ORDER — POLYETHYLENE GLYCOL 3350 17 G PO PACK: 17.0000 g | PACK | Freq: Every day | ORAL | 0 refills | Status: DC | PRN

## 2024-02-04 MED ORDER — SENNOSIDES-DOCUSATE SODIUM 8.6-50 MG PO TABS: 1.0000 | ORAL_TABLET | Freq: Every evening | ORAL | 0 refills | Status: DC | PRN

## 2024-02-04 MED ORDER — NAPROXEN 250 MG PO TABS: 250.0000 mg | ORAL_TABLET | Freq: Two times a day (BID) | ORAL | 0 refills | Status: DC

## 2024-02-04 MED ORDER — POLYETHYLENE GLYCOL 3350 17 G PO PACK
17.0000 g | PACK | Freq: Every day | ORAL | Status: DC
  Administered 2024-02-04 – 2024-02-06 (×3): 17 g via ORAL
  Filled 2024-02-04 (×5): qty 1

## 2024-02-04 NOTE — Evaluation (Signed)
 Physical Therapy Evaluation Patient Details Name: Mary Adkins MRN: [id number] DOB: 18-Jun-1940 Today's Date: 02/04/2024  History of Present Illness  Mary Adkins is a 83 y.o. female who had a mechanical fall  after stepping out of her golf cart and missing a step resulting in a right ankle displaced trimalleolar fracture. S/p ORIF right ankle fracture with Norleen Armor, MD 02/03/2024 .  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  Clinical Impression  Pt tolerated session well, able to perform bed mobility and tranfers with assist, as well as in room ambulation with RW with R LE NWB. During gait the RW was not steady and she fatigued easily. Pt discussed hopes to still DC home if she could be in her recliner and transfers to from bedside commode and WC when needed, and have HHPT. At this time feel the best is [facility], but will continue to follow to see how patient progresses and how her caregivers at home can assist her or not at this time. Pt has friend visiting her at this time that is staying for about a week ( per patient) , and dtr close by as well as othr family to assist when needed. Will see family tomorrow to assess their ability and continue to assess pt's ability.       If plan is discharge home, recommend the following: A little help with walking and/or transfers;A little help with bathing/dressing/bathroom;Assistance with cooking/housework;Help with stairs or ramp for entrance;Assist for transportation   Can travel by private vehicle   Yes (if can be recieved in [facility] at next location.)    Equipment Recommendations None recommended by PT  Recommendations for Other Services       Functional Status Assessment Patient has had a recent decline in their functional status and  demonstrates the ability to make significant improvements in function in a reasonable and predictable amount of time.     Precautions / Restrictions Precautions Precautions: Fall Restrictions Weight Bearing Restrictions Per Provider Order: Yes RLE Weight Bearing Per Provider Order: Non weight bearing Other Position/Activity Restrictions: in soft case with Ace wrap.      Mobility  Bed Mobility Overal bed mobility: Modified Independent             General bed mobility comments: HOB elevated, use of bed rails and increased time to progress from fowlers position to sitting EOB    Transfers Overall transfer level: Needs assistance Equipment used: Rolling walker (2 wheels) Transfers: Sit to/from Stand, Bed to chair/wheelchair/BSC Sit to Stand: Min assist           General transfer comment: cues for hand placement and safety with RW. Pt did move a to quickly to stand with slight LOB at RW.    Ambulation/Gait Ambulation/Gait assistance: Min assist Gait Distance (Feet): 20 Feet Assistive device: Rolling walker (2 wheels)         General Gait Details: hopping on LLE to mainatin RLE NWB. Did this pretty well, except a bit shakey with progressing the RW and steadying inbetween hopping, so Min A constantly needed for this. At times told her to stop and puase to regain balance and composer to try to go slower and more steady.  Stairs            Psychologist, [employer]  Tilt Bed    Modified Rankin (Stroke Patients Only)       Balance                                             Pertinent Vitals/Pain Pain Assessment Faces Pain Scale: Hurts a little bit (pt states not a lot of pain in her R LE .) Pain Location: RLE Pain Descriptors / Indicators: Guarding    Home Living Family/patient expects to be discharged to:: Private residence Living Arrangements: Alone Available Help at Discharge: Family;Available PRN/intermittently Type of Home:  Mobile home Home Access: Stairs to enter Entrance Stairs-Rails: Can reach both Entrance Stairs-Number of Steps: 2   Home Layout: One level Home Equipment: Pharmacist, Hospital (2 wheels);Wheelchair - power;BSC/3in1;Lift chair Additional Comments: pt cared for elderly parents so has all DME from this. Grandson lives next door, and dtr lives few houese down. Friend is stayign with her at the moment who can help, not sure when he leaves.    Prior Function Prior Level of Function : Independent/Modified Independent             Mobility Comments: no AD, very mobile and drives golf cart in neighborhood. ADLs Comments: reports she was independent with ADL     Extremity/Trunk Assessment        Lower Extremity Assessment Lower Extremity Assessment: Generalized weakness (weak feelign with hopping on 1 foot for ambulation ( to be expected))       Communication   Communication Communication: No apparent difficulties    Cognition Arousal: Alert Behavior During Therapy: WFL for tasks assessed/performed   PT - Cognitive impairments: No apparent impairments                         Following commands: Intact       Cueing Cueing Techniques: Verbal cues     General Comments      Exercises     Assessment/Plan    PT Assessment Patient needs continued PT services  PT Problem List Decreased strength;Decreased activity tolerance;Decreased balance;Decreased mobility;Decreased knowledge of use of DME       PT Treatment Interventions DME instruction;Gait training;Stair training;Functional mobility training;Therapeutic activities;Therapeutic exercise;Balance training;Patient/family education    PT Goals (Current goals can be found in the Care Plan section)  Acute Rehab PT Goals Patient Stated Goal: I would prefer to go home if possible , but understand if safer to go to [facility] for rehab first. PT Goal Formulation: With patient Time For Goal Achievement:  02/11/24 Potential to Achieve Goals: Fair    Frequency Min 3X/week     Co-evaluation               AM-PAC PT 6 Clicks Mobility  Outcome Measure Help needed turning from your back to your side while in a flat bed without using bedrails?: None Help needed moving from lying on your back to sitting on the side of a flat bed without using bedrails?: A Little Help needed moving to and from a bed to a chair (including a wheelchair)?: A Little Help needed standing up from a chair using your arms (e.g., wheelchair or bedside chair)?: A Little Help needed to walk in hospital room?: A Little Help needed climbing 3-5 steps with a railing? : Total 6 Click Score: 17    End of Session Equipment Utilized During  Treatment: Gait belt Activity Tolerance: Patient tolerated treatment well Patient left: in bed;with bed alarm set;with call bell/phone within reach Nurse Communication: Mobility status PT Visit Diagnosis: Other abnormalities of gait and mobility (R26.89);Unsteadiness on feet (R26.81)    Time: 8489-8451 PT Time Calculation (min) (ACUTE ONLY): 38 min   Charges:   PT Evaluation $PT Eval Low Complexity: 1 Low PT Treatments $Gait Training: 8-22 mins PT General Charges $$ ACUTE PT VISIT: 1 Visit         Katricia Prehn, PT, MPT Acute Rehabilitation Services Office: [phone] If a weekend: secure chat groups: [facility] PT, [facility] OT, [facility] SLP 02/04/2024   Basheer Molchan 02/04/2024, 8:48 PM

## 2024-02-04 NOTE — Progress Notes (Signed)
 PROGRESS NOTE Mary Adkins  FMW:[id number] DOB: 1940/09/09 DOA: 02/02/2024 PCP: Brien Josette HERO, PA-C (Inactive)  Brief Narrative/Hospital Course: Mary Adkins is a 83 y.o. 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  No prodromal symptoms, no head trauma or LOC. He denied fever, chills, rhinorrhea, sore throat, wheezing or hemoptysis.  No chest pain, palpitations, diaphoresis, PND, orthopnea or pitting edema of the lower extremities.  No abdominal pain, nausea, emesis, diarrhea, constipation, melena or hematochezia.  No flank pain, dysuria, frequency or hematuria.  No polyuria, polydipsia, polyphagia or blurred vision.  Lab work: CBC was normal.  BMP showed a glucose of 111 and a calcium of 8.7 mg/dL.  The rest of the electrolytes and the renal function were normal. Imaging: Portable 1 view chest radiograph showing low lung volumes with bibasilar atelectasis.  Right ankle x-ray showing acute displaced distal fibular, medial malleolar and posterior malleolar fractures.  Tibial talar joint dislocation with posterolateral displacement of the talus relative to the tibial plafond.  Diffuse soft tissue swelling of the ankle.  Repeat x-rays after reduction showed improvement displacement and reduction of the posterior ankle dislocation.  CT of the right ankle showing plain x-rays findings and punctate intra-articular which is fragments the superior medial joint and the anterior joint space of the lateral side.  There is Sinco for initial edema of the lower leg and ankle. ED course: Initial vital signs were temperature 97.5 F, pulse 98, respirations 20, BP 145/115 mmHg and O2 sat 94% on room air.  She  received fentanyl  50 mcg IVP x 4, morphine  2 mg IVP x 1, Percocet 5/325 mg 1 tablet p.o. and propofol  41 mg IVP x 2 during fracture reduction.  SEEN BY ORTHOPEDICS Status post ORIF of right ankle, NWB RLE continue multimodal pain management Waiting for PT clearance for discharge home Patient and daughter wanting to go home today 11/16  Subjective: Seen and examined today Resting comfortably. eager to go home today, daughter at bedside in agreement. overnight on 2.1 L [place] afebrile, VSS, Labs stable CMP creatinine down to 1.0 from 1.2, CBC with mild anemia 10.9 stable from y   Assessment and plan:  Closed right ankle fracture: Status post ORIF of right ankle. per orthopedic patient interested to go home today and awaiting PT clearance for discharge.  RTC in 2 weeks to see orthopedics Per ORTHO Nonweightbearing for 6 weeks postop. Follow-up in the office in 2 weeks for suture removal and conversion to a short leg cast.  Patient tells me that she worked with PT and they have recommended skilled nursing facility before returning home Per Dr. Ernie okay to continue aspirin  81 twice daily if going home for DVT prophylaxis.  Hypertension: Stable not on meds    Hyperlipidemia Continue ezetimibe 10 mg p.o. daily.   Stage 3a chronic kidney disease  Monitor renal function electrolytes.   Atelectasis Advised to do incentive spirometry frequently.   Hypercalcemia Recheck calcium normal 8.9    Hyperglycemia: Resolved blood sugar 96 this morning   Vitamin D deficiency Continue vitamin D supplementation.   Mobility: PT Orders: Active  PT Follow up Rec:    DVT prophylaxis: enoxaparin  (LOVENOX ) injection 40 mg Start: 02/04/24  1000 SCDs Start: 02/03/24 1430 SCDs Start: 02/03/24 9170 Code Status:   Code Status: Full Code Family Communication: plan of care discussed with patient daughter at bedside. Patient status is: Remains hospitalized because of severity of illness Level of care:  Telemetry   Dispo:The patient is from: home            Anticipated disposition:  snf Objective: Vitals last 24 hrs: Vitals:   02/04/24 0110 02/04/24 0526 02/04/24 0950 02/04/24 1315  BP: 125/71 (!) 140/69 112/64 (!) 116/59  Pulse: 94 83 85 95  Resp: 16 15 17 17   Temp: 97.9 F (36.6 C) 97.7 F (36.5 C) 98 F (36.7 C) 97.8 F (36.6 C)  TempSrc: Oral  Oral Oral  SpO2: 97% 99% 95% 94%  Weight:      Height:       Physical Examination: General exam: alert awake, oriented, older than stated age HEENT:Oral mucosa moist, Ear/Nose WNL grossly Respiratory system: Bilaterally clear BS,no use of accessory muscle Cardiovascular system: S1 & S2 +, No JVD. Gastrointestinal system: Abdomen soft,NT,ND, BS+ Nervous System: Alert, awake, moving all extremities,and following commands. Extremities: extremities warm, leg edema neg. right ankle with dressing in place Skin: Warm, no rashes MSK: Normal muscle bulk,tone, power     Data Reviewed: I have personally reviewed following labs and imaging studies ( see epic result tab) CBC: Recent Labs  Lab 02/03/24 0019 02/03/24 1649 02/04/24 0340  WBC 9.9 7.1 5.6  NEUTROABS 7.1 4.1  --   HGB 12.5 10.8* 10.9*  HCT 38.9 34.3* 34.5*  MCV 89.0 92.0 90.6  PLT 268 228 198   CMP: Recent Labs  Lab 02/03/24 0019 02/03/24 1501 02/04/24 0340  NA 139  --  136  K 3.8  --  4.1  CL 103  --  101  CO2 26  --  29  GLUCOSE 111*  --  96  BUN 12  --  14  CREATININE 0.87 1.28* 1.05*  CALCIUM 8.7*  --  8.9   GFR: Estimated Creatinine Clearance: 44.5 mL/min (A) (by C-G formula based on SCr of 1.05 mg/dL (H)). Recent Labs  Lab 02/04/24 0340  AST 24  ALT 12  ALKPHOS 52  BILITOT 0.4  PROT 6.1*  ALBUMIN  3.6   No results for input(s): LIPASE, AMYLASE in the last 168 hours. No results for input(s): AMMONIA in the last 168 hours. Coagulation Profile: No results for input(s): INR, PROTIME in the last 168 hours. Unresulted Labs (From admission,  onward)     Start     Ordered   02/10/24 0500  Creatinine, serum  (enoxaparin  (LOVENOX )    CrCl >/= 30 ml/min)  Weekly,   R     Comments: while on enoxaparin  therapy    02/03/24 1429           Antimicrobials/Microbiology: Anti-infectives (From admission, onward)    Start     Dose/Rate Route Frequency Ordered Stop   02/03/24 1350  vancomycin  (VANCOCIN ) powder  Status:  Discontinued          As needed 02/03/24 1350 02/03/24 1350   02/03/24 1100  ceFAZolin  (ANCEF ) IVPB 2g/100 mL premix        2 g 200 mL/hr over 30 Minutes Intravenous On call to O.R. 02/03/24 1054 02/03/24 1238      No results found for: SDES, SPECREQUEST, CULT, REPTSTATUS  Procedures: Procedure(s) (LRB): OPEN REDUCTION INTERNAL FIXATION (ORIF) ANKLE FRACTURE (Right)   Mennie LAMY, MD [facility] 02/04/2024, 2:45 PM

## 2024-02-04 NOTE — Hospital Course (Addendum)
 Mary Adkins is a 83 y.o. 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  No prodromal symptoms, no head trauma or LOC. He denied fever, chills, rhinorrhea, sore throat, wheezing or hemoptysis.  No chest pain, palpitations, diaphoresis, PND, orthopnea or pitting edema of the lower extremities.  No abdominal pain, nausea, emesis, diarrhea, constipation, melena or hematochezia.  No flank pain, dysuria, frequency or hematuria.  No polyuria, polydipsia, polyphagia or blurred vision.  Lab work: CBC was normal.  BMP showed a glucose of 111 and a calcium of 8.7 mg/dL.  The rest of the electrolytes and the renal function were normal. Imaging: Portable 1 view chest radiograph showing low lung volumes with bibasilar atelectasis.  Right ankle x-ray showing acute displaced distal fibular, medial malleolar and posterior malleolar fractures.  Tibial talar joint dislocation with posterolateral displacement of the talus relative to the tibial plafond.  Diffuse soft tissue swelling of the ankle.  Repeat x-rays after reduction showed improvement displacement and reduction of the posterior ankle dislocation.  CT of the right ankle showing plain x-rays findings and punctate intra-articular which is fragments the superior medial joint and the anterior joint space of the lateral side.  There is [place] for initial edema of the lower leg and ankle. ED course: Initial vital signs were temperature 97.5 F, pulse 98, respirations 20, BP 145/115 mmHg and O2 sat 94% on room air.  She received fentanyl  50 mcg IVP x 4, morphine  2 mg IVP x 1, Percocet 5/325 mg 1 tablet p.o. and propofol  41 mg IVP x 2 during fracture reduction.  SEEN BY  ORTHOPEDICS Status post ORIF of right ankle, NWB RLE continue multimodal pain management Waiting for PT clearance for discharge home Patient and daughter wanting to go home today 11/16  Subjective: Seen and examined today Resting comfortably. eager to go home today, daughter at bedside in agreement. overnight on 2.1 L [place] afebrile, VSS, Labs stable CMP creatinine down to 1.0 from 1.2, CBC with mild anemia 10.9 stable from y   Assessment and plan:  Closed right ankle fracture: Status post ORIF of right ankle. per orthopedic patient interested to go home today and awaiting PT clearance for discharge.  RTC in 2 weeks to see orthopedics Per ORTHO Nonweightbearing for 6 weeks postop. Follow-up in the office in 2 weeks for suture removal and conversion to a short leg cast.  Patient tells me that she worked with PT and they have recommended skilled nursing facility before returning home Per Dr. Ernie okay to continue aspirin  81 twice daily if going home for DVT prophylaxis.  Hypertension: Stable not on meds    Hyperlipidemia Continue ezetimibe 10 mg p.o. daily.   Stage 3a chronic kidney disease  Monitor renal function electrolytes.   Atelectasis Advised to do incentive spirometry frequently.   Hypercalcemia Recheck calcium normal 8.9    Hyperglycemia: Resolved blood sugar 96 this morning   Vitamin D deficiency Continue vitamin D supplementation.   Mobility: PT Orders: Active  PT Follow up Rec:    DVT prophylaxis: enoxaparin  (LOVENOX ) injection 40 mg Start: 02/04/24 1000 SCDs Start: 02/03/24 1430 SCDs Start: 02/03/24 0829 Code Status:   Code Status: Full Code Family Communication: plan of  care discussed with patient daughter at bedside. Patient status is: Remains hospitalized because of severity of illness Level of care: Telemetry   Dispo:The patient is from: home            Anticipated disposition:  snf Objective: Vitals last 24 hrs: Vitals:   02/04/24 0110 02/04/24  0526 02/04/24 0950 02/04/24 1315  BP: 125/71 (!) 140/69 112/64 (!) 116/59  Pulse: 94 83 85 95  Resp: 16 15 17 17   Temp: 97.9 F (36.6 C) 97.7 F (36.5 C) 98 F (36.7 C) 97.8 F (36.6 C)  TempSrc: Oral  Oral Oral  SpO2: 97% 99% 95% 94%  Weight:      Height:       Physical Examination: General exam: alert awake, oriented, older than stated age HEENT:Oral mucosa moist, Ear/Nose WNL grossly Respiratory system: Bilaterally clear BS,no use of accessory muscle Cardiovascular system: S1 & S2 +, No JVD. Gastrointestinal system: Abdomen soft,NT,ND, BS+ Nervous System: Alert, awake, moving all extremities,and following commands. Extremities: extremities warm, leg edema neg. right ankle with dressing in place Skin: Warm, no rashes MSK: Normal muscle bulk,tone, power

## 2024-02-04 NOTE — Discharge Summary (Signed)
 Physician Discharge Summary  DOROTA HEINRICHS FMW:[id number] DOB: [birth year] DOA: 02/02/2024  PCP: Brien Josette HERO, PA-C (Inactive)  Admit date: 02/02/2024 Discharge date: 02/07/2024 Recommendations for Outpatient Follow-up:  Follow up with PCP in 1 weeks-call for appointment Please obtain BMP/CBC in one week  Fu Dr Kit 14 days from DAY OF SURGERY (02/03/24)  Discharge Dispo: Home w/ [facility] Discharge Condition: Stable Code Status:   Code Status: Full Code Diet recommendation:  Diet Order             Diet - low sodium heart healthy           Diet general           Diet regular Room service appropriate? Yes; Fluid consistency: Thin  Diet effective now                    Brief/Interim Summary: Mary Adkins is a 83 y.o. 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  No prodromal symptoms, no head trauma or LOC. He denied fever, chills, rhinorrhea, sore throat, wheezing or hemoptysis.  No chest pain, palpitations, diaphoresis, PND, orthopnea or pitting edema of the lower extremities.  No abdominal pain, nausea, emesis, diarrhea, constipation, melena or hematochezia.  No flank pain, dysuria, frequency or hematuria.  No polyuria, polydipsia, polyphagia or blurred vision.  Lab work: CBC was normal.  BMP showed a glucose of 111 and a calcium of 8.7 mg/dL.  The rest of the electrolytes and the renal function were normal. Imaging: Portable 1 view chest radiograph showing low lung volumes with bibasilar atelectasis.  Right ankle x-ray showing acute displaced distal fibular, medial malleolar and posterior malleolar fractures.  Tibial talar joint dislocation with posterolateral displacement  of the talus relative to the tibial plafond.  Diffuse soft tissue swelling of the ankle.  Repeat x-rays after reduction showed improvement displacement and reduction of the posterior ankle dislocation.  CT of the right ankle showing plain x-rays findings and punctate intra-articular which is fragments the superior medial joint and the anterior joint space of the lateral side.  There is [place] for initial edema of the lower leg and ankle. ED course: Initial vital signs were temperature 97.5 F, pulse 98, respirations 20, BP 145/115 mmHg and O2 sat 94% on room air.  She received fentanyl  50 mcg IVP x 4, morphine  2 mg IVP x 1, Percocet 5/325 mg 1 tablet p.o. and propofol  41 mg IVP x 2 during fracture reduction.  SEEN BY ORTHOPEDICS Status post ORIF of right ankle, NWB RLE continue multimodal pain management Overall with PT OT did not do well SNF recommended-but patient has been adamant on going home> subsequently she has agreed for SNF  Subjective: Seen and examined Overall feeling much improved.  No bowel movement but no bloating or distention Overnight afebrile BP stable Agreed for SNF at this point and pending.  Discharge Diagnoses:   Closed right ankle fracture: S/p ORIF right ankle.  Orthopedics following closely continue PT OT Cont Nonweightbearing for 6 weeks postop. Follow-up in the office in 2 weeks for suture removal and conversion to a short leg cast.Per Dr. Ernie lovenox  here and Xarelto upon  dc for vte prophylaxis. Patient  eager to go home but PT OT recommending SNF, daughter is in agreement.  TOC following  Hypertension: Stable not on meds    Hyperlipidemia Continue ezetimibe 10 mg p.o. daily.   Stage 3a chronic kidney disease  Stable    Atelectasis Advised to do incentive spirometry frequently.   Hypercalcemia Recheck calcium normal 8.9    Hyperglycemia: Resolved blood sugar 96 this morning   Vitamin D deficiency Continue vitamin D supplementation.  Mild anemia  normocytic: Suspect in the setting of acute illness and some chronic component.  Transfuse if less than 7.  Monitor  Constipation: Continue laxatives stool softener/suppository as ordered, do enema before d/c.  Mobility: PT Orders: Active  PT Follow up Rec: Skilled Nursing-Short Term Rehab (<3 Hours/Day)02/05/2024 1554   DVT prophylaxis: enoxaparin  (LOVENOX ) injection 40 mg Start: 02/04/24 1000 SCDs Start: 02/03/24 1430 SCDs Start: 02/03/24 9170 Code Status:   Code Status: Full Code Family Communication: plan of care discussed with patient daughter at bedside. Patient status is: Remains hospitalized because of severity of illness Level of care: Telemetry   Dispo:The patient is from: home            Anticipated disposition:  SNF [facility] today Objective: Vitals last 24 hrs: Vitals:   02/06/24 1327 02/06/24 1925 02/07/24 0430 02/07/24 0817  BP: 135/70 130/72 125/71 125/65  Pulse: 90 97 (!) 102 98  Resp: 14 16 17    Temp: 99 F (37.2 C) (!) 97.5 F (36.4 C) 97.9 F (36.6 C)   TempSrc: Oral Oral    SpO2: 97% 91% 95% 98%  Weight:      Height:       Physical Examination: General exam: AAOX3 HEENT:Oral mucosa moist, Ear/Nose WNL grossly Respiratory system: CTA Bilaterally Cardiovascular system: S1 & S2 +, No JVD. Gastrointestinal system: Abdomen soft,NT,ND, BS+ Nervous System: Alert, awake, moving all extremities,and following commands. Extremities: extremities warm, leg edema neg. RT foot dressing C/D/I  Skin: Warm, no rashes MSK: Normal muscle bulk,tone, power    Procedure(s) (LRB): OPEN REDUCTION INTERNAL FIXATION (ORIF) ANKLE FRACTURE (Right)  Consultation: See note.  Discharge Instructions  Discharge Instructions     Diet - low sodium heart healthy   Complete by: As directed    Diet general   Complete by: As directed    Discharge instructions   Complete by: As directed    Nonweightbearing for 6 weeks postop. Follow-up in the office in 2 weeks for suture  removal and conversion to a short leg cast.    Please call call MD or return to ER for similar or worsening recurring problem that brought you to hospital or if any fever,nausea/vomiting,abdominal pain, uncontrolled pain, chest pain,  shortness of breath or any other alarming symptoms.  Please follow-up your doctor as instructed in a week time and call the office for appointment.  Please avoid alcohol, smoking, or any other illicit substance and maintain healthy habits including taking your regular medications as prescribed.  You were cared for by a hospitalist during your hospital stay. If you have any questions about your discharge medications or the care you received while you were in the hospital after you are discharged, you can call the unit and ask to speak with the hospitalist on call if the hospitalist that took care of you is not available.  Once you are discharged, your primary care physician will handle any further medical issues. Please note that NO REFILLS for any discharge medications will be authorized once you are discharged, as it  is imperative that you return to your primary care physician (or establish a relationship with a primary care physician if you do not have one) for your aftercare needs so that they can reassess your need for medications and monitor your lab values   Discharge instructions   Complete by: As directed    Please call call MD or return to ER for similar or worsening recurring problem that brought you to hospital or if any fever,nausea/vomiting,abdominal pain, uncontrolled pain, chest pain,  shortness of breath or any other alarming symptoms.  Please follow-up your doctor as instructed in a week time and call the office for appointment.  Please avoid alcohol, smoking, or any other illicit substance and maintain healthy habits including taking your regular medications as prescribed.  You were cared for by a hospitalist during your hospital stay. If you have  any questions about your discharge medications or the care you received while you were in the hospital after you are discharged, you can call the unit and ask to speak with the hospitalist on call if the hospitalist that took care of you is not available.  Once you are discharged, your primary care physician will handle any further medical issues. Please note that NO REFILLS for any discharge medications will be authorized once you are discharged, as it is imperative that you return to your primary care physician (or establish a relationship with a primary care physician if you do not have one) for your aftercare needs so that they can reassess your need for medications and monitor your lab values   Discharge wound care:   Complete by: As directed    REINFORCE DRESSING AS NEEDED.SABRA Nonweightbearing for 6 weeks postop. Follow-up in the office in 2 weeks for suture removal and conversion to a short leg cast.   Discharge wound care:   Complete by: As directed    Reinforce dressing  Until discontinued   Increase activity slowly   Complete by: As directed    Increase activity slowly   Complete by: As directed       Allergies as of 02/07/2024       Reactions   Acetazolamide Swelling, Other (See Comments)   Face swelled and peeled   Amlodipine  Other (See Comments)   Dizziness   Atorvastatin Other (See Comments), Nausea And Vomiting   Muscle aches   Penicillins Rash, Other (See Comments)   NUMBNESS IN ARMS Has patient had a PCN reaction causing immediate rash, facial/tongue/throat swelling, SOB or lightheadedness with hypotension: Unknown Has patient had a PCN reaction causing severe rash involving mucus membranes or skin necrosis: Unknown Has patient had a PCN reaction that required hospitalization: No Has patient had a PCN reaction occurring within the last 10 years: No   Prednisone Nausea And Vomiting, Palpitations   TACHYCARDIA   Rosuvastatin Other (See Comments), Nausea And Vomiting    Muscle aches   Codeine Nausea And Vomiting   Diazepam Other (See Comments)   Excessive sleepiness        Medication List     STOP taking these medications    diclofenac  75 MG EC tablet Commonly known as: VOLTAREN    EQ Aspirin  Adult Low Dose 81 MG tablet Generic drug: aspirin  EC       TAKE these medications    amitriptyline 10 MG tablet Commonly known as: ELAVIL Take 30 mg by mouth at bedtime.   cetirizine 10 MG tablet Commonly known as: ZYRTEC Take 10 mg by mouth daily at 2 PM.  cholecalciferol  1000 units tablet Commonly known as: VITAMIN D Take 1,000 Units by mouth daily.   docusate sodium  100 MG capsule Commonly known as: Colace Take 1 capsule (100 mg total) by mouth 2 (two) times daily. While taking narcotic pain medicine.   ezetimibe 10 MG tablet Commonly known as: ZETIA Take 10 mg by mouth daily.   nicotine  polacrilex 2 MG gum Commonly known as: NICORETTE  Take 2 mg by mouth as needed for smoking cessation.   oxyCODONE  5 MG immediate release tablet Commonly known as: Roxicodone  Take 1 tablet (5 mg total) by mouth every 6 (six) hours as needed for up to 5 days.   polyethylene glycol 17 g packet Commonly known as: MIRALAX / GLYCOLAX Take 17 g by mouth 2 (two) times daily.   rivaroxaban 10 MG Tabs tablet Commonly known as: Xarelto Take 1 tablet (10 mg total) by mouth daily.   senna 8.6 MG Tabs tablet Commonly known as: SENOKOT Take 2 tablets (17.2 mg total) by mouth 2 (two) times daily.   Super Calcium 1500 (600 Ca) MG Tabs tablet Generic drug: calcium carbonate Take 1,200 mg by mouth daily with breakfast.   vitamin B-12 500 MCG tablet Commonly known as: CYANOCOBALAMIN  Take 250 mcg by mouth daily.               Discharge Care Instructions  (From admission, onward)           Start     Ordered   02/06/24 0000  Discharge wound care:       Comments: Reinforce dressing  Until discontinued   02/06/24 1435   02/04/24 0000   Discharge wound care:       Comments: REINFORCE DRESSING AS NEEDED.SABRA Nonweightbearing for 6 weeks postop. Follow-up in the office in 2 weeks for suture removal and conversion to a short leg cast.   02/04/24 1443            Contact information for follow-up providers     Kit Rush, MD. Schedule an appointment as soon as possible for a visit in 2 week(s).   Specialty: Orthopedic Surgery Contact information: [address] [place] [address] [phone]         Brien Josette HERO, PA-C Follow up in 1 week(s).   Specialty: Physician Assistant Contact information: [address] [place] [address] [phone]              Contact information for after-discharge care     Destination     [facility] .   Service: Skilled Nursing Contact information: [address] [place]  [address] [phone]                    Allergies  Allergen Reactions   Acetazolamide Swelling and Other (See Comments)    Face swelled and peeled   Amlodipine  Other (See Comments)    Dizziness   Atorvastatin Other (See Comments) and Nausea And Vomiting    Muscle aches   Penicillins Rash and Other (See Comments)    NUMBNESS IN ARMS Has patient had a PCN reaction causing immediate rash, facial/tongue/throat swelling, SOB or lightheadedness with hypotension: Unknown Has patient had a PCN reaction causing severe rash involving mucus membranes or skin necrosis: Unknown Has patient had a PCN reaction that required hospitalization: No Has patient had a PCN reaction occurring within the last 10 years: No     Prednisone Nausea And Vomiting and  Palpitations    TACHYCARDIA    Rosuvastatin Other (See Comments) and Nausea And Vomiting    Muscle aches   Codeine Nausea And Vomiting   Diazepam Other (See Comments)    Excessive sleepiness    The results of significant diagnostics from this hospitalization  (including imaging, microbiology, ancillary and laboratory) are listed below for reference.    Microbiology: No results found for this or any previous visit (from the past 240 hours).  Procedures/Studies: DG Chest Port 1 View Result Date: 02/03/2024 CLINICAL DATA:  Preop cardiovascular exam EXAM: PORTABLE CHEST 1 VIEW COMPARISON:  Chest x-ray performed June 01, 2023 FINDINGS: Linear densities are present in the lung bases bilaterally. These are increased when compared to the prior exam. Low lung volumes. Heart size within normal limits. No pleural effusion or pneumothorax. Radiopaque leads project over the thoracic spine. IMPRESSION: 1. Low lung volumes. 2. Worsening linear densities in the lung bases. This most likely represents atelectasis or scarring which is worsened since the prior exam. Electronically Signed   By: Maude Naegeli M.D.   On: 02/03/2024 09:04   CT ANKLE RIGHT WO CONTRAST Result Date: 02/02/2024 CLINICAL DATA:  Ankle trauma fracture EXAM: CT OF THE RIGHT ANKLE WITHOUT CONTRAST TECHNIQUE: Multidetector CT imaging of the right ankle was performed according to the standard protocol. Multiplanar CT image reconstructions were also generated. RADIATION DOSE REDUCTION: This exam was performed according to the departmental dose-optimization program which includes automated exposure control, adjustment of the mA and/or kV according to patient size and/or use of iterative reconstruction technique. COMPARISON:  02/02/2024 FINDINGS: Bones/Joint/Cartilage Acute mildly comminuted fracture involving the distal shaft and metaphysis of the fibula with residual 6 mm lateral and 7 mm posterior displacement of dominant distal fracture fragment. Acute medial malleolar fracture with residual 7 mm lateral displacement. Punctate fracture fragments at the medial joint space, coronal series 5, image 28. Also small intra-articular osseous fragments anteriorly, sagittal series 3 image 31, on the lateral side. Acute  mildly comminuted and displaced posterior malleolar fracture. Mild residual widening of the anterior joint space. Residual 9 mm lateral subluxation of the talar dome with respect to the distal tibia. More corticated punctate calcifications on the medial side of talonavicular joint, favored to represent small ossicles rather than fracture fragments. Ligaments Suboptimally assessed by CT. Muscles and Tendons No intramuscular fluid collections. No abnormal atrophy. No abnormal tendon sheath fluid collections. Soft tissues Circumferential edema at the lower leg and ankle. IMPRESSION: 1. Acute trimalleolar fracture with residual fracture displacement as described above. Residual 9 mm lateral subluxation of the talar dome with respect to the distal tibia and mild asymmetrical widening of the anterior joint space. Punctate intra-articular osseous fragments at the superomedial joint and the anterior joint space on the lateral side. 2. Circumferential edema at the lower leg and ankle. Electronically Signed   By: Luke Bun M.D.   On: 02/02/2024 23:12   DG Ankle Complete Right Result Date: 02/02/2024 EXAM: 3 OR MORE VIEW(S) XRAY OF THE RIGHT ANKLE 02/02/2024 10:17:00 PM CLINICAL HISTORY: injury COMPARISON: Right ankle radiographs earlier today. FINDINGS: BONES AND JOINTS: Trimalleolar fractures, mildly displaced, although improved with overlying cast. Persistent lateral talar shift with reduction of prior posterior ankle dislocation. SOFT TISSUES: Associated soft tissue swelling. IMPRESSION: 1. Trimalleolar fractures, mildly displaced, improved. 2. Persistent lateral talar shift with reduction of prior posterior ankle dislocation. Electronically signed by: Pinkie Pebbles MD 02/02/2024 10:22 PM EST RP Workstation: [id number]   DG Ankle Complete Right Result Date: 02/02/2024  EXAM: 3 OR MORE VIEW(S) XRAY OF THE ANKLE 02/02/2024 06:52:00 PM CLINICAL HISTORY: injury COMPARISON: None available. FINDINGS: BONES AND  JOINTS: Acute displaced distal fibular fracture. Acute displaced medial malleolar fracture. Acute displaced posterior malleolar fracture. Tibiotalar joint dislocation with posterolateral displacement of the talus in relation to the tibial plafond. SOFT TISSUES: Diffuse soft tissue swelling of the ankle. IMPRESSION: 1. Acute displaced distal fibular, medial malleolar, and posterior malleolar fractures. 2. Tibiotalar joint dislocation with posterolateral displacement of the talus relative to the tibial plafond. 3. Diffuse soft tissue swelling of the ankle. Electronically signed by: Lonni Necessary MD 02/02/2024 07:14 PM EST RP Workstation: [id number]    Labs: BNP (last 3 results) No results for input(s): BNP in the last 8760 hours. Basic Metabolic Panel: Recent Labs  Lab 02/03/24 0019 02/03/24 1501 02/04/24 0340 02/05/24 0341  NA 139  --  136 137  K 3.8  --  4.1 4.3  CL 103  --  101 101  CO2 26  --  29 29  GLUCOSE 111*  --  96 108*  BUN 12  --  14 11  CREATININE 0.87 1.28* 1.05* 0.86  CALCIUM 8.7*  --  8.9 8.8*   Liver Function Tests: Recent Labs  Lab 02/04/24 0340  AST 24  ALT 12  ALKPHOS 52  BILITOT 0.4  PROT 6.1*  ALBUMIN  3.6   No results for input(s): LIPASE, AMYLASE in the last 168 hours. No results for input(s): AMMONIA in the last 168 hours. CBC: Recent Labs  Lab 02/03/24 0019 02/03/24 1649 02/04/24 0340 02/05/24 0341  WBC 9.9 7.1 5.6 4.8  NEUTROABS 7.1 4.1  --   --   HGB 12.5 10.8* 10.9* 10.0*  HCT 38.9 34.3* 34.5* 31.5*  MCV 89.0 92.0 90.6 90.5  PLT 268 228 198 193   CBG: No results for input(s): GLUCAP in the last 168 hours. Urinalysis    Component Value Date/Time   COLORURINE YELLOW 02/02/2009 0952   APPEARANCEUR CLEAR 02/02/2009 0952   LABSPEC 1.012 02/02/2009 0952   PHURINE 6.0 02/02/2009 0952   GLUCOSEU NEGATIVE 02/02/2009 0952   HGBUR NEGATIVE 02/02/2009 0952   BILIRUBINUR NEGATIVE 02/02/2009 0952   KETONESUR NEGATIVE 02/02/2009  0952   PROTEINUR NEGATIVE 02/02/2009 0952   UROBILINOGEN 0.2 02/02/2009 0952   NITRITE NEGATIVE 02/02/2009 0952   LEUKOCYTESUR  02/02/2009 0952    NEGATIVE MICROSCOPIC NOT DONE ON URINES WITH NEGATIVE PROTEIN, BLOOD, LEUKOCYTES, NITRITE, OR GLUCOSE <1000 mg/dL.   Sepsis Labs Recent Labs  Lab 02/03/24 0019 02/03/24 1649 02/04/24 0340 02/05/24 0341  WBC 9.9 7.1 5.6 4.8   Microbiology No results found for this or any previous visit (from the past 240 hours).   Time coordinating discharge: 35 minutes  SIGNED: Mennie LAMY, MD  [facility] 02/07/2024, 11:49 AM  If 7PM-7AM, please contact night-coverage [email or web address]

## 2024-02-04 NOTE — Progress Notes (Signed)
 Patient ID: Charolette DELENA Winchester, female   DOB: 06-26-1940, 83 y.o.   MRN: [id number] Subjective: 1 Day Post-Op Procedure(s) (LRB): OPEN REDUCTION INTERNAL FIXATION (ORIF) ANKLE FRACTURE (Right)    Patient reports pain as mild. History of BLE neuropathy No events Able to get up and use bedside commode  Objective:   VITALS:   Vitals:   02/04/24 0110 02/04/24 0526  BP: 125/71 (!) 140/69  Pulse: 94 83  Resp: 16 15  Temp: 97.9 F (36.6 C) 97.7 F (36.5 C)  SpO2: 97% 99%    Incision: dressing C/D/I - RLE  LABS Recent Labs    02/03/24 0019 02/03/24 1649 02/04/24 0340  HGB 12.5 10.8* 10.9*  HCT 38.9 34.3* 34.5*  WBC 9.9 7.1 5.6  PLT 268 228 198    Recent Labs    02/03/24 0019 02/03/24 1501 02/04/24 0340  NA 139  --  136  K 3.8  --  4.1  BUN 12  --  14  CREATININE 0.87 1.28* 1.05*  GLUCOSE 111*  --  96    No results for input(s): LABPT, INR in the last 72 hours.   Assessment/Plan: 1 Day Post-Op Procedure(s) (LRB): OPEN REDUCTION INTERNAL FIXATION (ORIF) ANKLE FRACTURE (Right)   Up with therapy NWB RLE Patient interested in going home today Can be discharged if cleared by PT RTC in 2 weeks to see [facility]

## 2024-02-04 NOTE — Evaluation (Signed)
 Occupational Therapy Evaluation Patient Details Name: Mary Adkins MRN: [id number] DOB: 1940/12/23 Today's Date: 02/04/2024   History of Present Illness   Mary Adkins is a 83 y.o. 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 S/p ORIF right ankle fracture with Norleen Armor, MD.     Clinical Impressions PTA, pt reports she was living alone and independent with ADL/IADL and functional mobility. Pt currently requires modA for sit<>stand and stand-pivot transfer EOB<>recliner. She requires multimodal cues for safety with transfers. She requires modA for LB dressing. Patient will benefit from continued inpatient follow up therapy, <3 hours/day. Will continue to follow acutely and progress appropriately.      If plan is discharge home, recommend the following:   A lot of help with walking and/or transfers;A little help with bathing/dressing/bathroom     Functional Status Assessment   Patient has had a recent decline in their functional status and demonstrates the ability to make significant improvements in function in a reasonable and predictable amount of time.     Equipment Recommendations   None recommended by OT     Recommendations for Other Services         Precautions/Restrictions   Precautions Precautions: Fall Restrictions Weight Bearing Restrictions Per Provider Order: Yes RLE Weight Bearing Per Provider Order: Non weight bearing     Mobility Bed Mobility Overal bed mobility: Modified Independent             General bed mobility comments: HOB elevated, use of bed rails and increased time to progress from  fowlers position to sitting EOB    Transfers Overall transfer level: Needs assistance Equipment used: Rolling walker (2 wheels) Transfers: Sit to/from Stand, Bed to chair/wheelchair/BSC Sit to Stand: Mod assist Stand pivot transfers: Mod assist         General transfer comment: modA to powerup into standing, cues for safe hand placement, assist for stability. Pt with good adherence to NWB on RLE with stand pivot transfer EOB<>Recliner      Balance Overall balance assessment: Needs assistance Sitting-balance support: No upper extremity supported, Feet supported Sitting balance-Leahy Scale: Fair     Standing balance support: Bilateral upper extremity supported, Reliant on assistive device for balance, During functional activity Standing balance-Leahy Scale: Poor                             ADL either performed or assessed with clinical judgement   ADL Overall ADL's : Needs assistance/impaired Eating/Feeding: Set up;Sitting Eating/Feeding Details (indicate cue type and reason): provided pt with lunch at end of session Grooming: Set up;Sitting   Upper Body Bathing: Set up;Sitting   Lower Body Bathing: Minimal assistance;Sit to/from stand   Upper Body Dressing : Set up;Sitting   Lower Body Dressing: Moderate assistance;Sit to/from stand   Toilet Transfer: Moderate assistance;Stand-pivot;Rolling walker (2 wheels) Toilet Transfer Details (indicate cue type and reason): simulated from EOB<>recliner Toileting- Clothing Manipulation and Hygiene: Moderate assistance;Sit to/from stand       Functional mobility during ADLs: Moderate assistance;Rolling walker (2 wheels) General ADL Comments: vc for safe hand placement with transfers     Vision  Perception         Praxis         Pertinent Vitals/Pain Pain Assessment Pain Assessment: Faces Faces Pain Scale: Hurts little more Pain Location: RLE Pain Descriptors / Indicators: Guarding Pain  Intervention(s): Limited activity within patient's tolerance, Monitored during session     Extremity/Trunk Assessment Upper Extremity Assessment Upper Extremity Assessment: Overall WFL for tasks assessed   Lower Extremity Assessment Lower Extremity Assessment: Generalized weakness;Defer to PT evaluation       Communication Communication Communication: No apparent difficulties   Cognition Arousal: Alert Behavior During Therapy: WFL for tasks assessed/performed Cognition: No apparent impairments             OT - Cognition Comments: WNL for basic tasks assessed                 Following commands: Intact       Cueing  General Comments   Cueing Techniques: Verbal cues  vss on RA   Exercises     Shoulder Instructions      Home Living Family/patient expects to be discharged to:: Private residence Living Arrangements: Alone Available Help at Discharge: Family;Available PRN/intermittently Type of Home: Mobile home Home Access: Stairs to enter Entrance Stairs-Number of Steps: 2 Entrance Stairs-Rails: Right Home Layout: One level     Bathroom Shower/Tub: Producer, Television/film/video: Standard     Home Equipment: Shower seat          Prior Functioning/Environment Prior Level of Function : Independent/Modified Independent             Mobility Comments: no AD ADLs Comments: reports she was independent with ADL    OT Problem List: Decreased activity tolerance;Impaired balance (sitting and/or standing);Decreased knowledge of use of DME or AE;Decreased knowledge of precautions;Pain   OT Treatment/Interventions: Self-care/ADL training;Therapeutic exercise;DME and/or AE instruction;Patient/family education;Balance training      OT Goals(Current goals can be found in the care plan section)   Acute Rehab OT Goals Patient Stated Goal: to go home OT Goal Formulation: With patient Time For Goal Achievement: 02/18/24 Potential to Achieve Goals:  Good ADL Goals Pt Will Perform Lower Body Dressing: with min assist;sit to/from stand Pt Will Transfer to Toilet: with min assist;stand pivot transfer Additional ADL Goal #1: Pt will demonstrate independence with 3 fall prevention strategies.   OT Frequency:  Min 2X/week    Co-evaluation              AM-PAC OT 6 Clicks Daily Activity     Outcome Measure Help from another person eating meals?: A Little Help from another person taking care of personal grooming?: A Little Help from another person toileting, which includes using toliet, bedpan, or urinal?: A Lot Help from another person bathing (including washing, rinsing, drying)?: A Lot Help from another person to put on and taking off regular upper body clothing?: A Little Help from another person to put on and taking off regular lower body clothing?: A Lot 6 Click Score: 15   End of Session Equipment Utilized During Treatment: Rolling walker (2 wheels);Gait belt Nurse Communication: Mobility status  Activity Tolerance: Patient tolerated treatment well Patient left: with call bell/phone within reach;in chair;with chair alarm set;with family/visitor present  OT Visit Diagnosis: Other abnormalities of gait and mobility (R26.89);History of falling (Z91.81);Pain Pain - Right/Left: Right Pain - part of body: Ankle and joints of foot                Time: 8857-8794  OT Time Calculation (min): 23 min Charges:  OT General Charges $OT Visit: 1 Visit OT Evaluation $OT Eval Moderate Complexity: 1 Mod OT Treatments $Self Care/Home Management : 8-22 mins  Verneita OTR/L Acute Rehabilitation Services Office: [phone]   Verneita ONEIDA Moose 02/04/2024, 1:39 PM

## 2024-02-04 NOTE — Plan of Care (Signed)
   Problem: Coping: Goal: Level of anxiety will decrease Outcome: Progressing   Problem: Pain Managment: Goal: General experience of comfort will improve and/or be controlled Outcome: Progressing   Problem: Safety: Goal: Ability to remain free from injury will improve Outcome: Progressing

## 2024-02-04 NOTE — TOC Progression Note (Signed)
 Transition of Care [facility]) - Progression Note    Patient Details  Name: Mary Adkins MRN: [id number] Date of Birth: 06-10-1940  [facility] of [facility] CM/SW Contact  Jon ONEIDA Anon, RN Phone Number: 02/04/2024, 4:30 PM  Clinical Narrative:    PT/OT evaluating pt for [facility] PT/OT vs SNF. Pt would like to return home but lives alone and is  NWB on lower extremity. Therapy will continue to work with her to try to progress to needed level to return home. In case this is unsuccessful referrals for SNF placement have been sent out. IP CM to continue to follow.                     Expected Discharge Plan and Services         Expected Discharge Date: 02/04/24                                     Social Drivers of Health (SDOH) Interventions SDOH Screenings   Food Insecurity: No Food Insecurity (02/03/2024)  Housing: Low Risk  (02/03/2024)  Transportation Needs: No Transportation Needs (02/03/2024)  Utilities: Not At Risk (02/03/2024)  Financial Resource Strain: Low Risk  (05/23/2023)   Received from [facility]  Physical Activity: Insufficiently Active (12/27/2022)   Received from [facility]  Social Connections: Socially Isolated (02/03/2024)  Stress: No Stress Concern Present (12/27/2022)   Received from [facility]  Tobacco Use: Medium Risk (02/03/2024)    Readmission Risk Interventions     No data to display

## 2024-02-04 NOTE — NC FL2 (Cosign Needed Addendum)
 [place]  MEDICAID [id number] LEVEL OF CARE FORM     IDENTIFICATION  Patient Name: Mary Adkins Birthdate: 12/05/1940 Sex: female Admission Date (Current Location): 02/02/2024  [facility] and [place] Number:      Facility and Address:  [facility],  [address], [place] [address]      Provider Number: [id number]  Attending Physician Name and Address:  Christobal Guadalajara, MD  Relative Name and Phone Number:  Mary Adkins (Daughter)  [phone]    Current Level of Care: Hospital Recommended Level of Care: Skilled Nursing Facility Prior Approval Number:    Date Approved/Denied:   PASRR Number: [id number]  Discharge Plan: SNF    Current Diagnoses: Patient Active Problem List   Diagnosis Date Noted   Hyperlipidemia 02/03/2024   Stage 3a chronic kidney disease (HCC) 02/03/2024   Vitamin D deficiency 02/03/2024   Closed right ankle fracture 02/03/2024   Atelectasis 02/03/2024   Hypercalcemia 02/03/2024   Hyperglycemia 02/03/2024   Lumbar stenosis with neurogenic claudication 05/19/2017   Postoperative anemia due to acute blood loss 06/02/2012   Hypokalemia 06/02/2012   Hypertension    Heart murmur    Stroke (HCC)    Lumbosacral disc disease    Osteoarthritis of hip    Neuromuscular disorder (HCC)     Orientation RESPIRATION BLADDER Height & Weight     Self, Time, Situation, Place  Normal Continent Weight: 81.6 kg Height:  5' 6 (167.6 cm)  BEHAVIORAL SYMPTOMS/MOOD NEUROLOGICAL BOWEL NUTRITION STATUS      Continent Diet (Regular, Thin liquids)  AMBULATORY STATUS COMMUNICATION OF NEEDS Skin   Extensive Assist Verbally Surgical wounds, Other (Comment) (Surgical Incision Ankle Right)                       Personal Care Assistance Level of Assistance  Bathing, Feeding, Dressing Bathing Assistance: Limited assistance Feeding assistance: Independent Dressing Assistance: Limited assistance     Functional Limitations Info  Sight, Hearing, Speech  Sight Info: Impaired Forensic Psychologist) Hearing Info: Adequate Speech Info: Adequate    SPECIAL CARE FACTORS FREQUENCY  PT (By licensed PT), OT (By licensed OT)     PT Frequency: 5x/wk OT Frequency: 5x/wk            Contractures Contractures Info: Not present    Additional Factors Info  Code Status, Allergies Code Status Info: FULL Allergies Info: Acetazolamide, Amlodipine , Atorvastatin, Penicillins, Prednisone, Rosuvastatin, Codeine, Diazepam           Current Medications (02/04/2024):  This is the current hospital active medication list Current Facility-Administered Medications  Medication Dose Route Frequency Provider Last Rate Last Admin   acetaminophen  (TYLENOL ) tablet 650 mg  650 mg Oral Q6H PRN Kit Rush, MD       Or   acetaminophen  (TYLENOL ) suppository 650 mg  650 mg Rectal Q6H PRN Kit Rush, MD       amitriptyline (ELAVIL) tablet 30 mg  30 mg Oral QHS Kit Rush, MD   30 mg at 02/03/24 2045   aspirin  EC tablet 81 mg  81 mg Oral q AM Kit Rush, MD   81 mg at 02/04/24 0619   bisacodyl  (DULCOLAX) suppository 10 mg  10 mg Rectal Daily PRN Kit Rush, MD       cholecalciferol  (VITAMIN D3) 25 MCG (1000 UNIT) tablet 1,000 Units  1,000 Units Oral Daily Kit Rush, MD   1,000 Units at 02/04/24 0912   cyanocobalamin  (VITAMIN B12) tablet 250 mcg  250 mcg Oral Daily Hewitt,  John, MD   250 mcg at 02/04/24 0911   docusate sodium  (COLACE) capsule 100 mg  100 mg Oral BID Kit Rush, MD   100 mg at 02/04/24 9087   enoxaparin  (LOVENOX ) injection 40 mg  40 mg Subcutaneous Q24H Kit Rush, MD   40 mg at 02/04/24 0911   ezetimibe (ZETIA) tablet 10 mg  10 mg Oral Daily Kit Rush, MD   10 mg at 02/04/24 9087   fentaNYL  (SUBLIMAZE ) injection 50 mcg  50 mcg Intravenous Q2H PRN Kit Rush, MD   50 mcg at 02/03/24 2047   HYDROcodone -acetaminophen  (NORCO/VICODIN) 5-325 MG per tablet 1 tablet  1 tablet Oral Q6H PRN Kit Rush, MD   1 tablet at 02/04/24 1250   naproxen  (NAPROSYN) tablet 250 mg  250 mg Oral BID WC Hewitt, John, MD   250 mg at 02/04/24 9087   ondansetron  (ZOFRAN ) tablet 4 mg  4 mg Oral Q6H PRN Kit Rush, MD       Or   ondansetron  (ZOFRAN ) injection 4 mg  4 mg Intravenous Q6H PRN Kit Rush, MD       Oral care mouth rinse  15 mL Mouth Rinse PRN Celinda Alm Lot, MD       polyethylene glycol (MIRALAX / GLYCOLAX) packet 17 g  17 g Oral Daily Kc, Ramesh, MD   17 g at 02/04/24 1037   senna-docusate (Senokot-S) tablet 1 tablet  1 tablet Oral QHS PRN Kit Rush, MD         Discharge Medications: Please see discharge summary for a list of discharge medications.  Relevant Imaging Results:  Relevant Lab Results:   Additional Information SSN: [id number]  Jon ONEIDA Anon, RN

## 2024-02-04 NOTE — Care Management CC44 (Signed)
 Condition Code 44 Documentation Completed  Patient Details  Name: Mary Adkins MRN: [id number] Date of Birth: 23-Mar-1940   Condition Code 44 given:  Yes Patient signature on Condition Code 44 notice:  Yes Documentation of 2 MD's agreement:  Yes Code 44 added to claim:  Yes    Jon ONEIDA Anon, RN 02/04/2024, 11:19 AM

## 2024-02-05 ENCOUNTER — Encounter (HOSPITAL_COMMUNITY): Payer: Self-pay | Admitting: Orthopedic Surgery

## 2024-02-05 DIAGNOSIS — S82891A Other fracture of right lower leg, initial encounter for closed fracture: Secondary | ICD-10-CM | POA: Diagnosis not present

## 2024-02-05 LAB — CBC
HCT: 31.5 % — ABNORMAL LOW (ref 36.0–46.0)
Hemoglobin: 10 g/dL — ABNORMAL LOW (ref 12.0–15.0)
MCH: 28.7 pg (ref 26.0–34.0)
MCHC: 31.7 g/dL (ref 30.0–36.0)
MCV: 90.5 fL (ref 80.0–100.0)
Platelets: 193 K/uL (ref 150–400)
RBC: 3.48 MIL/uL — ABNORMAL LOW (ref 3.87–5.11)
RDW: 13.8 % (ref 11.5–15.5)
WBC: 4.8 K/uL (ref 4.0–10.5)
nRBC: 0 % (ref 0.0–0.2)

## 2024-02-05 LAB — BASIC METABOLIC PANEL WITH GFR
Anion gap: 7 (ref 5–15)
BUN: 11 mg/dL (ref 8–23)
CO2: 29 mmol/L (ref 22–32)
Calcium: 8.8 mg/dL — ABNORMAL LOW (ref 8.9–10.3)
Chloride: 101 mmol/L (ref 98–111)
Creatinine, Ser: 0.86 mg/dL (ref 0.44–1.00)
GFR, Estimated: >60 mL/min (ref >60)
Glucose, Bld: 108 mg/dL — ABNORMAL HIGH (ref 70–99)
Potassium: 4.3 mmol/L (ref 3.5–5.1)
Sodium: 137 mmol/L (ref 135–145)

## 2024-02-05 MED ORDER — DOCUSATE SODIUM 100 MG PO CAPS: 100.0000 mg | ORAL_CAPSULE | Freq: Two times a day (BID) | ORAL | 0 refills | Status: DC

## 2024-02-05 MED ORDER — SENNA 8.6 MG PO TABS: 2.0000 | ORAL_TABLET | Freq: Two times a day (BID) | ORAL | 0 refills | Status: DC

## 2024-02-05 MED ORDER — OXYCODONE HCL 5 MG PO TABS: 5.0000 mg | ORAL_TABLET | Freq: Four times a day (QID) | ORAL | 0 refills | Status: AC | PRN

## 2024-02-05 MED ORDER — RIVAROXABAN 10 MG PO TABS: 10.0000 mg | ORAL_TABLET | Freq: Every day | ORAL | 0 refills | Status: DC

## 2024-02-05 MED ORDER — OXYCODONE HCL 5 MG PO TABS: 5.0000 mg | ORAL_TABLET | Freq: Four times a day (QID) | ORAL | 0 refills | Status: DC | PRN

## 2024-02-05 MED ORDER — RIVAROXABAN 10 MG PO TABS: 10.0000 mg | ORAL_TABLET | Freq: Every day | ORAL | 0 refills | Status: AC

## 2024-02-05 MED ORDER — DOCUSATE SODIUM 100 MG PO CAPS: 100.0000 mg | ORAL_CAPSULE | Freq: Two times a day (BID) | ORAL | 0 refills | Status: AC

## 2024-02-05 MED ORDER — SENNA 8.6 MG PO TABS: 2.0000 | ORAL_TABLET | Freq: Two times a day (BID) | ORAL | 0 refills | Status: AC

## 2024-02-05 NOTE — Progress Notes (Signed)
 PROGRESS NOTE Mary Adkins  FMW:[id number] DOB: Nov 19, 1940 DOA: 02/02/2024 PCP: Mary Josette HERO, PA-C (Inactive)  Brief Narrative/Hospital Course: Mary Adkins is a 83 y.o. 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  No prodromal symptoms, no head trauma or LOC. He denied fever, chills, rhinorrhea, sore throat, wheezing or hemoptysis.  No chest pain, palpitations, diaphoresis, PND, orthopnea or pitting edema of the lower extremities.  No abdominal pain, nausea, emesis, diarrhea, constipation, melena or hematochezia.  No flank pain, dysuria, frequency or hematuria.  No polyuria, polydipsia, polyphagia or blurred vision.  Lab work: CBC was normal.  BMP showed a glucose of 111 and a calcium of 8.7 mg/dL.  The rest of the electrolytes and the renal function were normal. Imaging: Portable 1 view chest radiograph showing low lung volumes with bibasilar atelectasis.  Right ankle x-ray showing acute displaced distal fibular, medial malleolar and posterior malleolar fractures.  Tibial talar joint dislocation with posterolateral displacement of the talus relative to the tibial plafond.  Diffuse soft tissue swelling of the ankle.  Repeat x-rays after reduction showed improvement displacement and reduction of the posterior ankle dislocation.  CT of the right ankle showing plain x-rays findings and punctate intra-articular which is fragments the superior medial joint and the anterior joint space of the lateral side.  There is [place] for initial edema of the lower leg and ankle. ED course: Initial vital signs were temperature 97.5 F, pulse 98, respirations 20, BP 145/115 mmHg and O2 sat 94% on room air.  She  received fentanyl  50 mcg IVP x 4, morphine  2 mg IVP x 1, Percocet 5/325 mg 1 tablet p.o. and propofol  41 mg IVP x 2 during fracture reduction.  SEEN BY ORTHOPEDICS Status post ORIF of right ankle, NWB RLE continue multimodal pain management Overall with PT OT did not do well SNF recommended  Subjective: Seen and examined Eager to work w/ PT and hoping she can still go home instead of SNF Overnight afebrile, BP stable on 2.5 L [place] Labs stable BMP CBC with mild ANEMIA  Assessment and plan:  Closed right ankle fracture: S/p ORIF right ankle.  Orthopedics following closely continue PT OT Cont Nonweightbearing for 6 weeks postop. Follow-up in the office in 2 weeks for suture removal and conversion to a short leg cast. Per Dr. Ernie lovenox  here and Xarelto upon  dc for vte prophylaxis.  Hypertension: Stable not on meds    Hyperlipidemia Continue ezetimibe 10 mg p.o. daily.   Stage 3a chronic kidney disease  Stable    Atelectasis Advised to do incentive spirometry frequently.   Hypercalcemia Recheck calcium normal 8.9    Hyperglycemia: Resolved blood sugar 96 this morning   Vitamin D deficiency Continue vitamin D supplementation.  Mild anemia normocytic: Suspect in the setting of acute illness and some chronic component.  Transfuse if less than 7.  Monitor   Mobility: PT Orders: Active  PT Follow up Rec: Skilled Nursing-Short Term Rehab (<3 Hours/Day)02/04/2024 2043   DVT prophylaxis: enoxaparin  (LOVENOX ) injection 40 mg Start: 02/04/24 1000 SCDs Start: 02/03/24 1430 SCDs Start: 02/03/24 9170 Code Status:   Code Status: Full Code Family Communication: plan of care discussed with patient daughter at  bedside. Patient status is: Remains hospitalized because of severity of illness Level of care: Telemetry   Dispo:The patient is from: home            Anticipated disposition:  HH  vs SNF Objective: Vitals last 24 hrs: Vitals:   02/04/24 1315 02/04/24 2109 02/05/24 0300  02/05/24 0552  BP: (!) 116/59 112/61  121/64  Pulse: 95   80  Resp: 17 18  16   Temp: 97.8 F (36.6 C) 98.9 F (37.2 C)  97.8 F (36.6 C)  TempSrc: Oral Oral    SpO2: 94% (!) 86% 94% 95%  Weight:      Height:       Physical Examination: General exam: alert awake, oriented HEENT:Oral mucosa moist, Ear/Nose WNL grossly Respiratory system: Bilaterally clear BS,no use of accessory muscle Cardiovascular system: S1 & S2 +, No JVD. Gastrointestinal system: Abdomen soft,NT,ND, BS+ Nervous System: Alert, awake, moving all extremities,and following commands. Extremities: extremities warm, leg edema neg. RT foot w/ dressing in place w/ mild swelling. Skin: Warm, no rashes MSK: Normal muscle bulk,tone, power     Data Reviewed: I have personally reviewed following labs and imaging studies ( see epic result tab) CBC: Recent Labs  Lab 02/03/24 0019 02/03/24 1649 02/04/24 0340 02/05/24 0341  WBC 9.9 7.1 5.6 4.8  NEUTROABS 7.1 4.1  --   --   HGB 12.5 10.8* 10.9* 10.0*  HCT 38.9 34.3* 34.5* 31.5*  MCV 89.0 92.0 90.6 90.5  PLT 268 228 198 193   CMP: Recent Labs  Lab 02/03/24 0019 02/03/24 1501 02/04/24 0340 02/05/24 0341  NA 139  --  136 137  K 3.8  --  4.1 4.3  CL 103  --  101 101  CO2 26  --  29 29  GLUCOSE 111*  --  96 108*  BUN 12  --  14 11  CREATININE 0.87 1.28* 1.05* 0.86  CALCIUM 8.7*  --  8.9 8.8*   GFR: Estimated Creatinine Clearance: 54.3 mL/min (by C-G formula based on SCr of 0.86 mg/dL). Recent Labs  Lab 02/04/24 0340  AST 24  ALT 12  ALKPHOS 52  BILITOT 0.4  PROT 6.1*  ALBUMIN  3.6   No results for input(s): LIPASE, AMYLASE in the last 168 hours. No results for input(s): AMMONIA in the last 168 hours. Coagulation Profile: No results for input(s): INR, PROTIME in the last 168 hours. Unresulted Labs (From admission, onward)     Start     Ordered   02/10/24 0500  Creatinine, serum  (enoxaparin  (LOVENOX )    CrCl >/= 30 ml/min)  Weekly,   R      Comments: while on enoxaparin  therapy    02/03/24 1429           Antimicrobials/Microbiology: Anti-infectives (From admission, onward)    Start     Dose/Rate Route Frequency Ordered Stop   02/03/24 1350  vancomycin  (VANCOCIN ) powder  Status:  Discontinued          As needed 02/03/24 1350 02/03/24 1350   02/03/24 1100  ceFAZolin  (ANCEF ) IVPB 2g/100 mL premix        2 g 200 mL/hr over 30 Minutes Intravenous On call to O.R. 02/03/24 1054 02/03/24 1238      No results found for: SDES, SPECREQUEST, CULT, REPTSTATUS  Procedures: Procedure(s) (LRB): OPEN REDUCTION INTERNAL FIXATION (ORIF) ANKLE FRACTURE (Right)   Mennie LAMY, MD [facility] 02/05/2024, 1:09 PM

## 2024-02-05 NOTE — Progress Notes (Signed)
 Physical Therapy Treatment Patient Details Name: Mary Adkins MRN: [id number] DOB: [birth year] Today's Date: 02/05/2024   History of Present Illness Mary Adkins is a 83 y.o. female who had a mechanical fall  after stepping out of her golf cart and missing a step resulting in a right ankle displaced trimalleolar fracture. S/p ORIF right ankle fracture with Norleen Armor, MD 02/03/2024 .  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    PT Comments  Dtr was present during this session. Pt with increased sensation and pain today and able to move toes better as well. Had a long session with pt and dtr present really discussing smart safe DC plan.  Worked on [place] ith RW and knee support. Still requiring hand on assist to steady , but doing a fair job and feel she will progress nicely with more days of practice. To be safe in her home she would need to be at a safe set up with WC level, practice safe tranfers in / out of recliner, WC , and 3 n 1 to set up safely and manage all day. She is not there yet. Looks of cues and recommends for safe set up that she is not recognizing. Famuily would be able to check on her, but she would have to be independent with WC level and set up to be alone most of the time. And she is not there yet. WE will keep workign on that while she is here and at this time recommend follow up therapy, <3 hours/day.      If plan is discharge home, recommend the following: A little help with walking and/or transfers;A little help with bathing/dressing/bathroom;Assistance with cooking/housework;Help with stairs or ramp for entrance;Assist for transportation   Can travel by private vehicle     Yes  Equipment Recommendations  None recommended by PT (pt states she has equipment)     Recommendations for Other Services       Precautions / Restrictions Precautions Precautions: Fall Splint/Cast - Date Prophylactic Dressing Applied (if applicable): 02/03/24 Restrictions Weight Bearing Restrictions Per Provider Order: Yes RLE Weight Bearing Per Provider Order: Non weight bearing Other Position/Activity Restrictions: in soft case with Ace wrap.     Mobility  Bed Mobility Overal bed mobility: Needs Assistance Bed Mobility: Supine to Sit, Sit to Supine     Supine to sit: Min assist Sit to supine: Min assist   General bed mobility comments: today had to assist pt with RLE on and off teh bed. Practiced 2 times    Transfers Overall transfer level: Needs assistance Equipment used: Rolling walker (2 wheels) Transfers: Sit to/from Stand, Bed to chair/wheelchair/BSC Sit to Stand: Min assist           General transfer comment: cues for hand placement and safety with RW. Pt did move a to quickly to stand with slight LOB at RW. PT had to help stabilize pt and walker during this transition. Practiced 5xs fior stand pivot with RW to from surfaces. Pt does better when she has arm rests to push up from . But cues need to prevent flop for decent safely into next sittign surface.    Ambulation/Gait Ambulation/Gait assistance: Min assist Gait Distance (Feet): 25 Feet Assistive device: Rolling walker (2 wheels)  General Gait Details: hopping with RW and R Knee support ( on RW) . This did help with more stability during hopping and distant, howevr pt still fatigued. The knee support made it challenging to stand and put knee in support without her foot hitting the back of the bed. This would need more practice, but was helpful while hopping.   Stairs             Wheelchair Mobility     Tilt Bed    Modified Rankin (Stroke Patients Only)       Balance Overall balance assessment: Needs assistance Sitting-balance support: No upper extremity  supported, Feet supported Sitting balance-Leahy Scale: Normal     Standing balance support: Bilateral upper extremity supported, Reliant on assistive device for balance, During functional activity Standing balance-Leahy Scale: Poor                              Communication Communication Communication: No apparent difficulties  Cognition Arousal: Alert Behavior During Therapy: WFL for tasks assessed/performed   PT - Cognitive impairments: No apparent impairments                         Following commands: Intact      Cueing Cueing Techniques: Verbal cues  Exercises      General Comments        Pertinent Vitals/Pain Pain Assessment Faces Pain Scale: Hurts little more Pain Location: R LE Pain Descriptors / Indicators: Aching, Throbbing Pain Intervention(s): Ice applied, Monitored during session (pt in more pain today, feel she may have had some of the block still helping yesterday. Today needing break through medicine she said.)    Home Living                          Prior Function            PT Goals (current goals can now be found in the care plan section) Acute Rehab PT Goals Patient Stated Goal: I would prefer to go home if possible , but understand if safer to go to ST-SNF for rehab first. PT Goal Formulation: With patient Time For Goal Achievement: 02/11/24 Potential to Achieve Goals: Fair Progress towards PT goals: Progressing toward goals    Frequency    Min 3X/week      PT Plan      Co-evaluation              AM-PAC PT 6 Clicks Mobility   Outcome Measure  Help needed turning from your back to your side while in a flat bed without using bedrails?: None Help needed moving from lying on your back to sitting on the side of a flat bed without using bedrails?: A Little Help needed moving to and from a bed to a chair (including a wheelchair)?: A Little Help needed standing up from a chair using your arms  (e.g., wheelchair or bedside chair)?: A Little Help needed to walk in hospital room?: A Little Help needed climbing 3-5 steps with a railing? : A Little 6 Click Score: 19    End of Session Equipment Utilized During Treatment: Gait belt Activity Tolerance: Patient tolerated treatment well Patient left: in bed;with bed alarm set;with call bell/phone within reach;with family/visitor present Nurse Communication: Mobility status PT Visit Diagnosis: Other abnormalities of gait and mobility (R26.89);Unsteadiness on feet (R26.81)     Time:  1400-1500 PT Time Calculation (min) (ACUTE ONLY): 60 min  Charges:    $Gait Training: 8-22 mins $Therapeutic Activity: 23-37 mins PT General Charges $$ ACUTE PT VISIT: 1 Visit                    Rinnah Peppel, PT, MPT Acute Rehabilitation Services Office: [phone] If a weekend: secure chat groups: [facility] PT, [facility] OT, [facility] SLP 02/05/2024    Reace Breshears 02/05/2024, 3:56 PM

## 2024-02-05 NOTE — TOC Progression Note (Addendum)
 Transition of Care [facility]) - Progression Note    Patient Details  Name: Mary Adkins MRN: [id number] Date of Birth: Oct 10, 1940  [facility]) CM/SW Contact  Alfonse JONELLE Rex, RN Phone Number: 02/05/2024, 2:36 PM  Clinical Narrative:   Teams chat from PT inquiring on status of SNF workup, bed offers available, patient prefers [facility] in [place], [place] reached out to [place] requesting review for bed offer.   3:45pm Met with patient at bedside to review short term rehab/SNF bed offers ( [address] [place], [place], [place], [place], [place]). Patient states she prefers to return home but her daughters would like for her to go to rehab before she goes home, she reports she resides alone and at baseline is independent with self care and functional mobility, reports he daughters live close by and are supportive. Patient confirmed that [facility] is her preference, await bed offer. SNF bed offer list left with patient to review with her daughter, request NCM follow up tomorrow for bed choice                       Expected Discharge Plan and Services         Expected Discharge Date: 02/04/24                                     Social Drivers of Health (SDOH) Interventions SDOH Screenings   Food Insecurity: No Food Insecurity (02/03/2024)  Housing: Low Risk  (02/03/2024)  Transportation Needs: No Transportation Needs (02/03/2024)  Utilities: Not At Risk (02/03/2024)  Financial Resource Strain: Low Risk  (05/23/2023)   Received from [facility]  Physical Activity: Insufficiently Active (12/27/2022)   Received from [facility]  Social Connections: Socially Isolated (02/03/2024)  Stress: No Stress Concern Present (12/27/2022)   Received from [facility]  Tobacco Use: Medium Risk (02/03/2024)    Readmission Risk Interventions     No data to display

## 2024-02-05 NOTE — Progress Notes (Signed)
 Subjective: 2 Days Post-Op Procedure(s) (LRB): OPEN REDUCTION INTERNAL FIXATION (ORIF) ANKLE FRACTURE (Right)  Patient reports pain as mild.  Denies fever, chills, N/V, CP, SOB.  Tolerating POs well.  Admits to flatus.  Hopeful to go home.  Objective:   VITALS:  Temp:  [97.8 F (36.6 C)-98.9 F (37.2 C)] 97.8 F (36.6 C) (11/17 0552) Pulse Rate:  [80-95] 80 (11/17 0552) Resp:  [16-18] 16 (11/17 0552) BP: (112-121)/(59-64) 121/64 (11/17 0552) SpO2:  [86 %-95 %] 95 % (11/17 0552)  General: WDWN patient in NAD. Psych:  Appropriate mood and affect. Neuro:  A&O x 3, Moving all extremities, sensation intact to light touch HEENT:  EOMs intact Chest:  Even non-labored respirations Skin:  SLS C/D/I, no rashes or lesions Extremities: warm/dry, no visible edema, erythema or echymosis.  No lymphadenopathy. Pulses: Popliteus 2+ MSK:  ROM: EHL/FHL intact, MMT: able to perform quad set   LABS Recent Labs    02/03/24 0019 02/03/24 1649 02/04/24 0340 02/05/24 0341  HGB 12.5 10.8* 10.9* 10.0*  WBC 9.9 7.1 5.6 4.8  PLT 268 228 198 193   Recent Labs    02/04/24 0340 02/05/24 0341  NA 136 137  K 4.1 4.3  CL 101 101  CO2 29 29  BUN 14 11  CREATININE 1.05* 0.86  GLUCOSE 96 108*   No results for input(s): LABPT, INR in the last 72 hours.   Assessment/Plan: 2 Days Post-Op Procedure(s) (LRB): OPEN REDUCTION INTERNAL FIXATION (ORIF) ANKLE FRACTURE (Right)  NWB R LE  Up with therapy DVT ppx:  Lovenox  in house; transition to Xarelto upon D/C D/C scripts on chart.  Rx for oxycodone  provided after searching PDMP. Disp:  Home with HHPT vs SNF Plan for 2 week outpatient post-op visit.  Eva Barrack PA-C [facility] Office:  [phone]

## 2024-02-06 DIAGNOSIS — S82891A Other fracture of right lower leg, initial encounter for closed fracture: Secondary | ICD-10-CM | POA: Diagnosis not present

## 2024-02-06 NOTE — Plan of Care (Signed)
  Problem: Clinical Measurements: Goal: Will remain free from infection Outcome: Progressing   Problem: Activity: Goal: Risk for activity intolerance will decrease Outcome: Progressing   Problem: Pain Managment: Goal: General experience of comfort will improve and/or be controlled Outcome: Progressing   Problem: Safety: Goal: Ability to remain free from injury will improve Outcome: Progressing

## 2024-02-06 NOTE — Plan of Care (Signed)
  Problem: Pain Managment: Goal: General experience of comfort will improve and/or be controlled Outcome: Progressing   Problem: Safety: Goal: Ability to remain free from injury will improve Outcome: Progressing

## 2024-02-06 NOTE — Progress Notes (Signed)
 PROGRESS NOTE Mary Adkins  FMW:[id number] DOB: [birth year] DOA: 02/02/2024 PCP: Brien Josette HERO, PA-C (Inactive)  Brief Narrative/Hospital Course: RASA DEGRAZIA is a 83 y.o. 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  No prodromal symptoms, no head trauma or LOC. He denied fever, chills, rhinorrhea, sore throat, wheezing or hemoptysis.  No chest pain, palpitations, diaphoresis, PND, orthopnea or pitting edema of the lower extremities.  No abdominal pain, nausea, emesis, diarrhea, constipation, melena or hematochezia.  No flank pain, dysuria, frequency or hematuria.  No polyuria, polydipsia, polyphagia or blurred vision.  Lab work: CBC was normal.  BMP showed a glucose of 111 and a calcium of 8.7 mg/dL.  The rest of the electrolytes and the renal function were normal. Imaging: Portable 1 view chest radiograph showing low lung volumes with bibasilar atelectasis.  Right ankle x-ray showing acute displaced distal fibular, medial malleolar and posterior malleolar fractures.  Tibial talar joint dislocation with posterolateral displacement of the talus relative to the tibial plafond.  Diffuse soft tissue swelling of the ankle.  Repeat x-rays after reduction showed improvement displacement and reduction of the posterior ankle dislocation.  CT of the right ankle showing plain x-rays findings and punctate intra-articular which is fragments the superior medial joint and the anterior joint space of the lateral side.  There is [place] for initial edema of the lower leg and ankle. ED course: Initial vital signs were temperature 97.5 F, pulse 98, respirations 20, BP 145/115 mmHg and O2 sat 94% on room air.  She  received fentanyl  50 mcg IVP x 4, morphine  2 mg IVP x 1, Percocet 5/325 mg 1 tablet p.o. and propofol  41 mg IVP x 2 during fracture reduction.  SEEN BY ORTHOPEDICS Status post ORIF of right ankle, NWB RLE continue multimodal pain management Overall with PT OT did not do well SNF recommended-but patient has been adamant on going home> subsequently she has agreed for SNF  Subjective: Seen and examined Alert awake complains of constipation and pain in her right ankle Overnight afebrile BP stable Agreed for SNF at this point  Assessment and plan:  Closed right ankle fracture: S/p ORIF right ankle.  Orthopedics following closely continue PT OT Cont Nonweightbearing for 6 weeks postop. Follow-up in the office in 2 weeks for suture removal and conversion to a short leg cast. Per Dr. Ernie lovenox  here and Xarelto upon  dc for vte prophylaxis. Patient eager to go home but PT OT recommending SNF, daughter is in agreement.  TOC following  Hypertension: Stable not on meds    Hyperlipidemia Continue ezetimibe 10 mg p.o. daily.   Stage 3a chronic kidney disease  Stable    Atelectasis Advised to do incentive spirometry frequently.   Hypercalcemia Recheck calcium normal 8.9    Hyperglycemia: Resolved blood sugar 96 this morning   Vitamin D deficiency Continue vitamin D supplementation.  Mild anemia normocytic: Suspect in the setting of acute illness and some chronic component.  Transfuse if less than 7.  Monitor  Constipation: Continue laxatives stool softener/suppository  Mobility: PT Orders: Active  PT Follow up Rec: Skilled Nursing-Short Term Rehab (<3 Hours/Day)02/05/2024 1554   DVT prophylaxis: enoxaparin  (LOVENOX ) injection 40 mg Start: 02/04/24 1000  SCDs Start: 02/03/24 1430 SCDs Start: 02/03/24 9170 Code Status:   Code Status: Full Code Family Communication: plan of care discussed with patient daughter at bedside. Patient status is: Remains hospitalized because of  severity of illness Level of care: Telemetry   Dispo:The patient is from: home            Anticipated disposition:  HH  vs SNF Objective: Vitals last 24 hrs: Vitals:   02/05/24 2148 02/06/24 0000 02/06/24 0446 02/06/24 0920  BP: 136/73  136/86 124/66  Pulse: 89  92 94  Resp: 18  18   Temp: 99.1 F (37.3 C)  98 F (36.7 C)   TempSrc: Oral  Oral   SpO2: (!) 89% 94% 93% 96%  Weight:      Height:       Physical Examination: General exam: AAOX3 HEENT:Oral mucosa moist, Ear/Nose WNL grossly Respiratory system: CTA Bilaterally Cardiovascular system: S1 & S2 +, No JVD. Gastrointestinal system: Abdomen soft,NT,ND, BS+ Nervous System: Alert, awake, moving all extremities,and following commands. Extremities: extremities warm, leg edema neg. RT foot w/ dressing in place w/ mild swelling and expected tenderness. Skin: Warm, no rashes MSK: Normal muscle bulk,tone, power     Data Reviewed: I have personally reviewed following labs and imaging studies ( see epic result tab) CBC: Recent Labs  Lab 02/03/24 0019 02/03/24 1649 02/04/24 0340 02/05/24 0341  WBC 9.9 7.1 5.6 4.8  NEUTROABS 7.1 4.1  --   --   HGB 12.5 10.8* 10.9* 10.0*  HCT 38.9 34.3* 34.5* 31.5*  MCV 89.0 92.0 90.6 90.5  PLT 268 228 198 193   CMP: Recent Labs  Lab 02/03/24 0019 02/03/24 1501 02/04/24 0340 02/05/24 0341  NA 139  --  136 137  K 3.8  --  4.1 4.3  CL 103  --  101 101  CO2 26  --  29 29  GLUCOSE 111*  --  96 108*  BUN 12  --  14 11  CREATININE 0.87 1.28* 1.05* 0.86  CALCIUM 8.7*  --  8.9 8.8*   GFR: Estimated Creatinine Clearance: 54.3 mL/min (by C-G formula based on SCr of 0.86 mg/dL). Recent Labs  Lab 02/04/24 0340  AST 24  ALT 12  ALKPHOS 52  BILITOT 0.4  PROT 6.1*  ALBUMIN  3.6   No results for input(s): LIPASE, AMYLASE in the last 168 hours. No results for input(s): AMMONIA in the last 168 hours. Coagulation Profile: No results for input(s): INR, PROTIME in the last 168  hours. Unresulted Labs (From admission, onward)     Start     Ordered   02/10/24 0500  Creatinine, serum  (enoxaparin  (LOVENOX )    CrCl >/= 30 ml/min)  Weekly,   R     Comments: while on enoxaparin  therapy    02/03/24 1429           Antimicrobials/Microbiology: Anti-infectives (From admission, onward)    Start     Dose/Rate Route Frequency Ordered Stop   02/03/24 1350  vancomycin  (VANCOCIN ) powder  Status:  Discontinued          As needed 02/03/24 1350 02/03/24 1350   02/03/24 1100  ceFAZolin  (ANCEF ) IVPB 2g/100 mL premix        2 g 200 mL/hr over 30 Minutes Intravenous On call to O.R. 02/03/24 1054 02/03/24 1238      No results found for: SDES, SPECREQUEST, CULT, REPTSTATUS  Procedures: Procedure(s) (LRB): OPEN REDUCTION INTERNAL FIXATION (ORIF) ANKLE FRACTURE (Right)   Mennie  CHRISTOBAL, MD [facility] 02/06/2024, 11:09 AM

## 2024-02-06 NOTE — TOC Progression Note (Signed)
 Transition of Care [facility]) - Progression Note    Patient Details  Name: [name] MRN: [id number] Date of Birth: 1941/01/06  Transition of Care [facility]) CM/SW Contact  Alfonse JONELLE Rex, RN Phone Number: 02/06/2024, 11:56 AM  Clinical Narrative:   Mary Adkins, made bed offer for semi-private room, meet with patient and daughter at bedside, pt/dtr accepted bed offer. SNF auth initiated and approved [facility] ID [id number], days approved 02/06/2024-02/08/2024                       Expected Discharge Plan and Services         Expected Discharge Date: 02/04/24                                     Social Drivers of Health (SDOH) Interventions SDOH Screenings   Food Insecurity: No Food Insecurity (02/03/2024)  Housing: Low Risk  (02/03/2024)  Transportation Needs: No Transportation Needs (02/03/2024)  Utilities: Not At Risk (02/03/2024)  Financial Resource Strain: Low Risk  (05/23/2023)   Received from [facility]  Physical Activity: Insufficiently Active (12/27/2022)   Received from [facility]  Social Connections: Socially Isolated (02/03/2024)  Stress: No Stress Concern Present (12/27/2022)   Received from [facility]  Tobacco Use: Medium Risk (02/03/2024)    Readmission Risk Interventions     No data to display

## 2024-02-06 NOTE — Progress Notes (Signed)
 Administering soap suds enema. Patient tolerated well. Awaiting results.

## 2024-02-07 DIAGNOSIS — E785 Hyperlipidemia, unspecified: Secondary | ICD-10-CM | POA: Diagnosis not present

## 2024-02-07 DIAGNOSIS — Z9889 Other specified postprocedural states: Secondary | ICD-10-CM | POA: Diagnosis not present

## 2024-02-07 DIAGNOSIS — R262 Difficulty in walking, not elsewhere classified: Secondary | ICD-10-CM | POA: Diagnosis not present

## 2024-02-07 DIAGNOSIS — S82851A Displaced trimalleolar fracture of right lower leg, initial encounter for closed fracture: Secondary | ICD-10-CM | POA: Diagnosis not present

## 2024-02-07 DIAGNOSIS — J9811 Atelectasis: Secondary | ICD-10-CM | POA: Diagnosis not present

## 2024-02-07 DIAGNOSIS — N1831 Chronic kidney disease, stage 3a: Secondary | ICD-10-CM | POA: Diagnosis not present

## 2024-02-07 DIAGNOSIS — R41841 Cognitive communication deficit: Secondary | ICD-10-CM | POA: Diagnosis not present

## 2024-02-07 DIAGNOSIS — R058 Other specified cough: Secondary | ICD-10-CM | POA: Diagnosis not present

## 2024-02-07 DIAGNOSIS — D649 Anemia, unspecified: Secondary | ICD-10-CM | POA: Diagnosis not present

## 2024-02-07 DIAGNOSIS — R6889 Other general symptoms and signs: Secondary | ICD-10-CM | POA: Diagnosis not present

## 2024-02-07 DIAGNOSIS — S82851D Displaced trimalleolar fracture of right lower leg, subsequent encounter for closed fracture with routine healing: Secondary | ICD-10-CM | POA: Diagnosis not present

## 2024-02-07 DIAGNOSIS — R2681 Unsteadiness on feet: Secondary | ICD-10-CM | POA: Diagnosis not present

## 2024-02-07 DIAGNOSIS — S82891A Other fracture of right lower leg, initial encounter for closed fracture: Secondary | ICD-10-CM | POA: Diagnosis not present

## 2024-02-07 DIAGNOSIS — R739 Hyperglycemia, unspecified: Secondary | ICD-10-CM | POA: Diagnosis not present

## 2024-02-07 DIAGNOSIS — M6281 Muscle weakness (generalized): Secondary | ICD-10-CM | POA: Diagnosis not present

## 2024-02-07 DIAGNOSIS — I129 Hypertensive chronic kidney disease with stage 1 through stage 4 chronic kidney disease, or unspecified chronic kidney disease: Secondary | ICD-10-CM | POA: Diagnosis not present

## 2024-02-07 DIAGNOSIS — I10 Essential (primary) hypertension: Secondary | ICD-10-CM | POA: Diagnosis not present

## 2024-02-07 DIAGNOSIS — G709 Myoneural disorder, unspecified: Secondary | ICD-10-CM | POA: Diagnosis not present

## 2024-02-07 DIAGNOSIS — E559 Vitamin D deficiency, unspecified: Secondary | ICD-10-CM | POA: Diagnosis not present

## 2024-02-07 DIAGNOSIS — R1310 Dysphagia, unspecified: Secondary | ICD-10-CM | POA: Diagnosis not present

## 2024-02-07 MED ORDER — POLYETHYLENE GLYCOL 3350 17 G PO PACK: 17.0000 g | PACK | Freq: Two times a day (BID) | ORAL | Status: AC

## 2024-02-07 MED ORDER — SENNOSIDES-DOCUSATE SODIUM 8.6-50 MG PO TABS
1.0000 | ORAL_TABLET | Freq: Two times a day (BID) | ORAL | Status: DC
  Administered 2024-02-07: 1 via ORAL
  Filled 2024-02-07: qty 1

## 2024-02-07 MED ORDER — FLEET ENEMA RE ENEM
1.0000 | ENEMA | Freq: Once | RECTAL | Status: AC
  Administered 2024-02-07: 1 via RECTAL
  Filled 2024-02-07: qty 1

## 2024-02-07 MED ORDER — POLYETHYLENE GLYCOL 3350 17 G PO PACK: 17.0000 g | PACK | Freq: Two times a day (BID) | ORAL | Status: DC

## 2024-02-07 NOTE — Progress Notes (Signed)
 Per social work pts bed ready at [facility], and pt will be going to room 10A. RN called Report and spoke to [place]. Discharge packet given to pts daughter who will bring it to facilty. PIV removed. Pts daughter will be her transportation to facility. Pt brought down to main entrance in wheelchair by NT.

## 2024-02-07 NOTE — TOC Transition Note (Signed)
 Transition of Care [facility]) - Discharge Note   Patient Details  Name: Mary Adkins MRN: [id number] Date of Birth: [birth year]  [facility]) CM/SW Contact:  Alfonse JONELLE Rex, RN Phone Number: 02/07/2024, 2:23 PM   Clinical Narrative:   DC to SNF/ [facility], RM  10a, daughter to transfer patient. No further INPT CM needs identified at this time.     Final next level of care: Skilled Nursing Facility Barriers to Discharge: Barriers Resolved   Patient Goals and CMS Choice Patient states their goals for this hospitalization and ongoing recovery are:: short term rehab/SNF at [facility] prior to return home CMS Medicare.gov Compare Post Acute Care list provided to:: Patient Choice offered to / list presented to : Patient [place] ownership interest in [facility].provided to:: Patient    Discharge Placement              Patient chooses bed at: [facility] Patient to be transferred to facility by: Daughter-Sherri Name of family member notified: family at bedside Patient and family notified of of transfer: 02/07/24  Discharge Plan and Services Additional resources added to the After Visit Summary for                                       Social Drivers of Health (SDOH) Interventions SDOH Screenings   Food Insecurity: No Food Insecurity (02/03/2024)  Housing: Low Risk  (02/03/2024)  Transportation Needs: No Transportation Needs (02/03/2024)  Utilities: Not At Risk (02/03/2024)  Financial Resource Strain: Low Risk  (05/23/2023)   Received from [facility]  Physical Activity: Insufficiently Active (12/27/2022)   Received from [facility]  Social Connections: Socially Isolated (02/03/2024)  Stress: No Stress Concern Present (12/27/2022)   Received from [facility]  Tobacco Use: Medium Risk (02/03/2024)     Readmission Risk Interventions     No data to display

## 2024-02-08 DIAGNOSIS — Z9889 Other specified postprocedural states: Secondary | ICD-10-CM | POA: Diagnosis not present

## 2024-02-08 DIAGNOSIS — N1831 Chronic kidney disease, stage 3a: Secondary | ICD-10-CM | POA: Diagnosis not present

## 2024-02-08 DIAGNOSIS — E785 Hyperlipidemia, unspecified: Secondary | ICD-10-CM | POA: Diagnosis not present

## 2024-02-08 DIAGNOSIS — I10 Essential (primary) hypertension: Secondary | ICD-10-CM | POA: Diagnosis not present

## 2024-02-12 DIAGNOSIS — J9811 Atelectasis: Secondary | ICD-10-CM | POA: Diagnosis not present

## 2024-02-12 DIAGNOSIS — R058 Other specified cough: Secondary | ICD-10-CM | POA: Diagnosis not present

## 2024-02-13 DIAGNOSIS — J9811 Atelectasis: Secondary | ICD-10-CM | POA: Diagnosis not present

## 2024-02-13 DIAGNOSIS — S82891A Other fracture of right lower leg, initial encounter for closed fracture: Secondary | ICD-10-CM | POA: Diagnosis not present

## 2024-02-13 DIAGNOSIS — I10 Essential (primary) hypertension: Secondary | ICD-10-CM | POA: Diagnosis not present

## 2024-02-21 DIAGNOSIS — S82851D Displaced trimalleolar fracture of right lower leg, subsequent encounter for closed fracture with routine healing: Secondary | ICD-10-CM | POA: Diagnosis not present

## 2024-02-21 DIAGNOSIS — Z4889 Encounter for other specified surgical aftercare: Secondary | ICD-10-CM | POA: Diagnosis not present
